# Patient Record
Sex: Male | Born: 1950 | Race: Black or African American | Hispanic: No | Marital: Married | Smoking: Former smoker
Health system: Southern US, Community
[De-identification: ages and names within clinical notes are randomized; demographics above are authoritative.]

## PROBLEM LIST (undated history)

## (undated) DIAGNOSIS — E119 Type 2 diabetes mellitus without complications: Secondary | ICD-10-CM

## (undated) DIAGNOSIS — E785 Hyperlipidemia, unspecified: Secondary | ICD-10-CM

## (undated) DIAGNOSIS — I10 Essential (primary) hypertension: Secondary | ICD-10-CM

## (undated) HISTORY — DX: Essential (primary) hypertension: I10

## (undated) HISTORY — DX: Hyperlipidemia, unspecified: E78.5

## (undated) HISTORY — DX: Type 2 diabetes mellitus without complications: E11.9

## (undated) HISTORY — PX: ROTATOR CUFF REPAIR: SHX139

---

## 2008-04-19 ENCOUNTER — Emergency Department (HOSPITAL_COMMUNITY): Admission: EM | Admit: 2008-04-19 | Discharge: 2008-04-20 | Payer: Self-pay | Admitting: Emergency Medicine

## 2011-10-23 ENCOUNTER — Ambulatory Visit (INDEPENDENT_AMBULATORY_CARE_PROVIDER_SITE_OTHER): Payer: 59 | Admitting: Family Medicine

## 2011-10-23 DIAGNOSIS — I10 Essential (primary) hypertension: Secondary | ICD-10-CM

## 2011-10-23 DIAGNOSIS — E785 Hyperlipidemia, unspecified: Secondary | ICD-10-CM

## 2011-10-23 DIAGNOSIS — T783XXA Angioneurotic edema, initial encounter: Secondary | ICD-10-CM

## 2011-10-23 DIAGNOSIS — E119 Type 2 diabetes mellitus without complications: Secondary | ICD-10-CM

## 2011-10-23 LAB — POCT CBC
Hemoglobin: 12.5 g/dL — AB (ref 14.1–18.1)
MCH, POC: 27.9 pg (ref 27–31.2)
MCV: 88.7 fL (ref 80–97)
RBC: 4.48 M/uL — AB (ref 4.69–6.13)
WBC: 7.5 10*3/uL (ref 4.6–10.2)

## 2011-10-23 MED ORDER — PREDNISONE 20 MG PO TABS: ORAL_TABLET | ORAL | Status: AC

## 2011-10-23 MED ORDER — FAMOTIDINE 40 MG PO TABS: 40.0000 mg | ORAL_TABLET | Freq: Every day | ORAL | Status: DC

## 2011-10-23 MED ORDER — CETIRIZINE HCL 10 MG PO TABS: 10.0000 mg | ORAL_TABLET | Freq: Every day | ORAL | Status: DC

## 2011-10-23 MED ORDER — METHYLPREDNISOLONE SODIUM SUCC 125 MG IJ SOLR
125.0000 mg | Freq: Once | INTRAMUSCULAR | Status: AC
  Administered 2011-10-23: 125 mg via INTRAVENOUS

## 2011-10-23 MED ORDER — EPINEPHRINE 0.3 MG/0.3ML IJ DEVI: 0.3000 mg | Freq: Once | INTRAMUSCULAR | Status: AC

## 2011-10-23 MED ORDER — SODIUM CHLORIDE 0.9 % IV SOLN: 125.0000 mg | Freq: Once | INTRAVENOUS | Status: DC

## 2011-10-23 NOTE — Progress Notes (Signed)
  Subjective:    Patient ID: Ryan Rosales, male    DOB: 1951-02-15, 61 y.o.   MRN: [id number]  HPI  Patient presents complaining of upper lip swelling that started this morning. Took BP medications, Norvasc and Lisinopril this AM and has been on these medications for some time. Takes Lipitor prn and has not had any in 5 days.  No new foods or body products No recent illness or HSV labialis  Review of Systems     Objective:   Physical Exam  Constitutional: He appears well-developed and well-nourished.  HENT:  Head: Normocephalic and atraumatic.  Mouth/Throat:    Neck: No thyromegaly present.  Cardiovascular: Normal rate, regular rhythm and normal heart sounds.   Pulmonary/Chest: Effort normal and breath sounds normal. He has no wheezes.  Lymphadenopathy:    He has no cervical adenopathy.  Neurological: He is alert.  Skin: Skin is warm. No rash (no urticaria) noted.  No swelling of tongue        Assessment & Plan:   1. Angioedema  POCT CBC, methylPREDNISolone sodium succinate (SOLU-MEDROL) 125 MG injection 125 mg, DISCONTINUED: methylPREDNISolone sodium succinate (SOLU-MEDROL) 130 mg in sodium chloride 0.9 % 50 mL IVPB  2. DM (diabetes mellitus)  POCT glucose (manual entry)  3. HTN (hypertension)    4. Dyslipidemia     See AVS and discharge medications Hold lisinopril until patient follows up with his primary MD.  Patient states he will schedule this appointment. Reviewed with patient proper indication and technique if Epi Pen is necessary. Anticipatory guidance.

## 2011-10-25 ENCOUNTER — Encounter: Payer: Self-pay | Admitting: Family Medicine

## 2011-10-25 ENCOUNTER — Telehealth: Payer: Self-pay

## 2011-10-25 NOTE — Telephone Encounter (Signed)
Pt records faxed via Epic. Ok to send records per Dr Hal Hope because Dr. Clarene Duke is patients PCP.

## 2011-10-25 NOTE — Telephone Encounter (Signed)
.  [facility] STATES THEY NEED THE LAST OV NOTES FAXED ON PT PLEASE FAX TO [phone] AND THE PHONE NUMBER IS [phone]

## 2013-05-19 ENCOUNTER — Ambulatory Visit (INDEPENDENT_AMBULATORY_CARE_PROVIDER_SITE_OTHER): Payer: 59 | Admitting: Family Medicine

## 2013-05-19 VITALS — BP 168/100 | HR 84 | Temp 98.2°F | Resp 16 | Ht 69.25 in | Wt 203.6 lb

## 2013-05-19 DIAGNOSIS — M25529 Pain in unspecified elbow: Secondary | ICD-10-CM

## 2013-05-19 DIAGNOSIS — M25522 Pain in left elbow: Secondary | ICD-10-CM

## 2013-05-19 DIAGNOSIS — M546 Pain in thoracic spine: Secondary | ICD-10-CM

## 2013-05-19 DIAGNOSIS — I10 Essential (primary) hypertension: Secondary | ICD-10-CM

## 2013-05-19 MED ORDER — CYCLOBENZAPRINE HCL 10 MG PO TABS: 10.0000 mg | ORAL_TABLET | Freq: Every evening | ORAL | Status: DC | PRN

## 2013-05-19 NOTE — Progress Notes (Signed)
  Subjective:    Patient ID: Ryan Rosales, male    DOB: [birth year], 62 y.o.   MRN: [id number]  HPI Left elbow pain and along left shoulder blade for 2 weeks. No injury. No history of gout. Has not been doing any strenuous house or yard work. Pain is sharp. Getting discoloration of skin over elbow. Tried his wife's lidocaine patches on back that helped some. Also tried some OTC Aleve with temporary relief. Pain is on tip of olecranon. Nothing seems to make pain worse. Good ROM of elbow. Pain along his scapula does bother him at night. His back is tender.   Review of Systems  Constitutional: Negative for fever and chills.  Neurological: Negative for weakness.  All other systems reviewed and are negative.       Objective:   Physical Exam  Constitutional: He appears well-developed and well-nourished. No distress.  HENT:  Head: Normocephalic and atraumatic.  Eyes: Conjunctivae are normal. No scleral icterus.  Cardiovascular: Normal rate.   Pulmonary/Chest: Effort normal.  Skin: Skin is warm and dry. No rash noted. No erythema.  Psychiatric: He has a normal mood and affect. His behavior is normal. Thought content normal.  MSK:  - Left Elbow: FROM in flexion, extension, pronation, supination without pain. Strength 5/5 without pain. Focal TTP over the tip of the olecranon with palpable thickening or abnormal deposits within. No TTP over medial or lateral epicondyle, antecubital fossa - Thoracic Back: No midline TTP. TTP over trap and rhomboid on left. Normal scapulohumeral rhythm. No winging     Assessment & Plan:  #1. Posterior left elbow pain - Suspect atypical olecranon bursitis as patient has typical pain but no swelling - Continue aleve 2 tabs bid - Ice tid x 15 min - Avoid pressure on olecranon, pad area - F/u with PCP in one week, consider oral prednisone if persistent - Do not suspect serious pathology within elbow joint or infection. Seems superficial in soft tissue so no xrays  at this time. Consider if no improvement in next couple weeks.  #2. Periscapular pain - Heat or ice as needed - Scapular stabilization exercises and theraband given - Flexeril qhs prn  #3. HTN, uncontrolled - notify PCP by phone tomorrow.

## 2013-05-19 NOTE — Patient Instructions (Addendum)
For elbow pain, I think that you have irritation of the olecranon bursa - ice 3x per day - use an elbow pad to avoid putting pressure on this - discuss possibility of trial of prednisone with PCP if no better by next week  For scapular pain - Do rowing exercises up high, same level, and down low 3 sets of 15 each arm - Use heat or ice - Take muscle relaxer at night.

## 2013-05-28 ENCOUNTER — Other Ambulatory Visit: Payer: Self-pay | Admitting: Family Medicine

## 2013-05-28 DIAGNOSIS — R748 Abnormal levels of other serum enzymes: Secondary | ICD-10-CM

## 2013-06-08 ENCOUNTER — Other Ambulatory Visit: Payer: Self-pay

## 2013-06-10 ENCOUNTER — Ambulatory Visit
Admission: RE | Admit: 2013-06-10 | Discharge: 2013-06-10 | Disposition: A | Discharge status: Home / Self Care | Payer: 59 | Source: Ambulatory Visit | Attending: Family Medicine | Admitting: Family Medicine

## 2013-06-10 DIAGNOSIS — R748 Abnormal levels of other serum enzymes: Secondary | ICD-10-CM

## 2013-07-17 NOTE — Progress Notes (Signed)
History and physical exam reviewed with Dr. Voss. Agree with A/P. 

## 2016-11-19 ENCOUNTER — Other Ambulatory Visit (HOSPITAL_COMMUNITY): Payer: Self-pay | Admitting: Gastroenterology

## 2016-11-19 DIAGNOSIS — K7581 Nonalcoholic steatohepatitis (NASH): Secondary | ICD-10-CM

## 2016-11-26 ENCOUNTER — Emergency Department (HOSPITAL_COMMUNITY): Payer: BLUE CROSS/BLUE SHIELD

## 2016-11-26 ENCOUNTER — Emergency Department (HOSPITAL_COMMUNITY)
Admission: EM | Admit: 2016-11-26 | Discharge: 2016-11-27 | Disposition: A | Discharge status: Home / Self Care | Payer: BLUE CROSS/BLUE SHIELD | Attending: Emergency Medicine | Admitting: Emergency Medicine

## 2016-11-26 ENCOUNTER — Encounter (HOSPITAL_COMMUNITY): Payer: Self-pay | Admitting: *Deleted

## 2016-11-26 DIAGNOSIS — Z79899 Other long term (current) drug therapy: Secondary | ICD-10-CM | POA: Diagnosis not present

## 2016-11-26 DIAGNOSIS — E119 Type 2 diabetes mellitus without complications: Secondary | ICD-10-CM | POA: Diagnosis not present

## 2016-11-26 DIAGNOSIS — R42 Dizziness and giddiness: Secondary | ICD-10-CM | POA: Insufficient documentation

## 2016-11-26 DIAGNOSIS — I959 Hypotension, unspecified: Secondary | ICD-10-CM | POA: Insufficient documentation

## 2016-11-26 DIAGNOSIS — Z87891 Personal history of nicotine dependence: Secondary | ICD-10-CM | POA: Diagnosis not present

## 2016-11-26 LAB — CBC WITH DIFFERENTIAL/PLATELET
BASOS PCT: 0 %
Basophils Absolute: 0 10*3/uL (ref 0.0–0.1)
EOS ABS: 0.2 10*3/uL (ref 0.0–0.7)
Eosinophils Relative: 4 %
HCT: 36.9 % — ABNORMAL LOW (ref 39.0–52.0)
Hemoglobin: 12.3 g/dL — ABNORMAL LOW (ref 13.0–17.0)
Lymphocytes Relative: 50 %
Lymphs Abs: 2.5 10*3/uL (ref 0.7–4.0)
MCH: 30.6 pg (ref 26.0–34.0)
MCHC: 33.3 g/dL (ref 30.0–36.0)
MCV: 91.8 fL (ref 78.0–100.0)
MONO ABS: 0.4 10*3/uL (ref 0.1–1.0)
MONOS PCT: 8 %
NEUTROS PCT: 38 %
Neutro Abs: 1.9 10*3/uL (ref 1.7–7.7)
PLATELETS: 215 10*3/uL (ref 150–400)
RBC: 4.02 MIL/uL — ABNORMAL LOW (ref 4.22–5.81)
RDW: 12.6 % (ref 11.5–15.5)
WBC: 5 10*3/uL (ref 4.0–10.5)

## 2016-11-26 MED ORDER — SODIUM CHLORIDE 0.9 % IV BOLUS (SEPSIS)
1000.0000 mL | Freq: Once | INTRAVENOUS | Status: AC
  Administered 2016-11-27: 1000 mL via INTRAVENOUS

## 2016-11-26 NOTE — ED Provider Notes (Signed)
[facility] DEPT Provider Note   CSN: [id number] Arrival date & time: 11/26/16  2131  By signing my name below, I, Margit Banda, attest that this documentation has been prepared under the direction and in the presence of Veryl Speak, MD. Electronically Signed: Margit Banda, ED Scribe. 11/26/16. 11:34 PM.   History   Chief Complaint Chief Complaint  Patient presents with  . Fatigue  . Hypotension    HPI Ryan Rosales is a 66 y.o. male with a PMHx of HTN, who presents to the Emergency Department complaining of a sudden drop in his blood pressure ~7/8 pm on 11/26/16. Pt reports taking his BP every morning (135/88) using his at home machine. About 12 hours later, while eating dinner, he became dizzy and confused. Per pt's wife, he struggled getting his food into his mouth, he had a hard time following directions, his face drooped and he seemed drunk, although he had not had any ETOH that day. They retook his BP and it was 85/66. Sx lasted ~ 5-10 minutes before resolving. Pt notes feeling groggy. Pt takes his HTN medication as prescribed. Pt denies weakness, fever, diarrhea, urgency, frequency, hematuria, dysuria, difficulty urinating and constipation.   The history is provided by the patient and the spouse. No language interpreter was used.    Past Medical History:  Diagnosis Date  . Diabetes mellitus without complication ([place])   . Hyperlipidemia   . Hypertension     Patient Active Problem List   Diagnosis Date Noted  . HTN (hypertension) 05/19/2013    Past Surgical History:  Procedure Laterality Date  . ROTATOR CUFF REPAIR         Home Medications    Prior to Admission medications   Medication Sig Start Date End Date Taking? Authorizing Provider  amLODipine (NORVASC) 5 MG tablet Take 5 mg by mouth daily.   Yes Historical Provider, MD  EPINEPHrine (EPIPEN) 0.3 mg/0.3 mL DEVI Inject 0.3 mLs (0.3 mg total) into the muscle once. 10/23/11  Yes Hayden Rasmussen, MD    losartan (COZAAR) 100 MG tablet Take 100 mg by mouth daily. 11/07/16  Yes Historical Provider, MD  Omega-3 Fatty Acids (FISH OIL PO) Take 1 capsule by mouth 2 (two) times daily.   Yes Historical Provider, MD  ranitidine (ZANTAC) 150 MG tablet Take 150 mg by mouth daily.    Yes Historical Provider, MD  cetirizine (ZYRTEC) 10 MG tablet Take 1 tablet (10 mg total) by mouth daily. Patient not taking: Reported on 11/26/2016 10/23/11 10/22/12  Hayden Rasmussen, MD  cyclobenzaprine (FLEXERIL) 10 MG tablet Take 1 tablet (10 mg total) by mouth at bedtime as needed for muscle spasms. Patient not taking: Reported on 11/26/2016 05/19/13   Duane Boston, MD  famotidine (PEPCID) 40 MG tablet Take 1 tablet (40 mg total) by mouth daily. Patient not taking: Reported on 11/26/2016 10/23/11 10/22/12  Hayden Rasmussen, MD    Family History Family History  Problem Relation Age of Onset  . Diabetes Mother     Social History Social History  Substance Use Topics  . Smoking status: Former Smoker    Types: Cigarettes    Quit date: 10/22/1962  . Smokeless tobacco: Never Used  . Alcohol use Yes     Allergies   Lisinopril   Review of Systems Review of Systems  Constitutional: Negative for fever.  Gastrointestinal: Negative for constipation and diarrhea.  Genitourinary: Negative for difficulty urinating, dysuria, frequency, hematuria and urgency.  Neurological: Positive for dizziness. Negative  for weakness.  All other systems reviewed and are negative.    Physical Exam Updated Vital Signs BP 112/75 (BP Location: Left Arm)   Pulse 94   Temp 98 F (36.7 C) (Oral)   Resp 18   SpO2 98%   Physical Exam  Constitutional: He appears well-developed and well-nourished.  HENT:  Head: Normocephalic.  Mouth/Throat: Oropharynx is clear and moist. No oropharyngeal exudate.  Eyes: Conjunctivae and EOM are normal. Pupils are equal, round, and reactive to light. Right eye exhibits no discharge. Left eye exhibits no  discharge. No scleral icterus.  Neck: Normal range of motion. Neck supple. No JVD present. No tracheal deviation present.  Trachea is midline. No stridor or carotid bruits.  Cardiovascular: Normal rate, regular rhythm, normal heart sounds and intact distal pulses.   No murmur heard. Pulmonary/Chest: Effort normal and breath sounds normal. No stridor. No respiratory distress. He has no wheezes. He has no rales.  Lungs CTA bilaterally.  Abdominal: Soft. Bowel sounds are normal. He exhibits no distension. There is no tenderness. There is no rebound and no guarding.  Musculoskeletal: Normal range of motion. He exhibits no edema or tenderness.  All compartments are soft. No palpable cords.   Lymphadenopathy:    He has no cervical adenopathy.  Neurological: He is alert. He has normal reflexes. No cranial nerve deficit. He exhibits normal muscle tone. Coordination normal.  Skin: Skin is warm and dry. Capillary refill takes less than 2 seconds.  Psychiatric: He has a normal mood and affect. His behavior is normal.  Nursing note and vitals reviewed.    ED Treatments / Results  DIAGNOSTIC STUDIES: Oxygen Saturation is 98% on RA, normal by my interpretation.   COORDINATION OF CARE: 11:19 PM-Discussed next steps with pt which include a head scan. Pt verbalized understanding and is agreeable with the plan.    Labs (all labs ordered are listed, but only abnormal results are displayed) Labs Reviewed - No data to display  EKG  EKG Interpretation None       Radiology No results found.  Procedures Procedures (including critical care time)  Medications Ordered in ED Medications - No data to display   Initial Impression / Assessment and Plan / ED Course  I have reviewed the triage vital signs and the nursing notes.  Pertinent labs & imaging results that were available during my care of the patient were reviewed by me and considered in my medical decision making (see chart for  details).  Patient presents here after an episode of hypotension and confusion that occurred at home. This lasted for approximately 5 minutes, then resolved. His blood pressures here have been stable and his workup reveals no obvious abnormality. Head CT is negative, EKG is unchanged, and laboratory studies are unremarkable.  I am uncertain as to what caused this episode. He had some sort of brief hypotensive episode according to the wife. I have considered the possibility of TIA, however do not feel this to be the case. Either way, I have advised him to follow-up with his primary Dr. in the next 2 days and return if his symptoms worsen or change.  Final Clinical Impressions(s) / ED Diagnoses   Final diagnoses:  None    New Prescriptions New Prescriptions   No medications on file   I personally performed the services described in this documentation, which was scribed in my presence. The recorded information has been reviewed and is accurate.       Veryl Speak, MD 11/27/16  0100  

## 2016-11-26 NOTE — ED Triage Notes (Signed)
Pt states he felt lethargic while eating at ~7PM today. Pt took his blood pressure which was 88/59 at the time. Pt states he still feels "a little bit woozy".   Pt states he has been taking his blood pressure medication as prescribed. Pt denies nausea, states he has had some dizziness.

## 2016-11-27 LAB — COMPREHENSIVE METABOLIC PANEL
ALT: 32 U/L (ref 17–63)
ANION GAP: 9 (ref 5–15)
AST: 35 U/L (ref 15–41)
Albumin: 4.1 g/dL (ref 3.5–5.0)
Alkaline Phosphatase: 65 U/L (ref 38–126)
BILIRUBIN TOTAL: 0.7 mg/dL (ref 0.3–1.2)
BUN: 18 mg/dL (ref 6–20)
CHLORIDE: 108 mmol/L (ref 101–111)
CO2: 26 mmol/L (ref 22–32)
Calcium: 9 mg/dL (ref 8.9–10.3)
Creatinine, Ser: 0.79 mg/dL (ref 0.61–1.24)
GFR calc Af Amer: >60 mL/min (ref >60)
Glucose, Bld: 116 mg/dL — ABNORMAL HIGH (ref 65–99)
POTASSIUM: 4.1 mmol/L (ref 3.5–5.1)
Sodium: 143 mmol/L (ref 135–145)
Total Protein: 7.7 g/dL (ref 6.5–8.1)

## 2016-11-27 LAB — TROPONIN I: Troponin I: <0.03 ng/mL (ref <0.03)

## 2016-11-27 NOTE — Discharge Instructions (Signed)
Continue your medications as before.  Follow-up with your primary Dr. in the next 2-3 days.

## 2016-12-17 ENCOUNTER — Ambulatory Visit (HOSPITAL_COMMUNITY)
Admission: RE | Admit: 2016-12-17 | Discharge: 2016-12-17 | Disposition: A | Discharge status: Home / Self Care | Payer: BLUE CROSS/BLUE SHIELD | Source: Ambulatory Visit | Attending: Gastroenterology | Admitting: Gastroenterology

## 2016-12-17 DIAGNOSIS — K7581 Nonalcoholic steatohepatitis (NASH): Secondary | ICD-10-CM

## 2016-12-24 ENCOUNTER — Ambulatory Visit (HOSPITAL_COMMUNITY): Payer: BLUE CROSS/BLUE SHIELD

## 2016-12-24 ENCOUNTER — Encounter (HOSPITAL_COMMUNITY): Payer: Self-pay

## 2017-01-02 ENCOUNTER — Ambulatory Visit (HOSPITAL_COMMUNITY)
Admission: RE | Admit: 2017-01-02 | Discharge: 2017-01-02 | Disposition: A | Discharge status: Home / Self Care | Payer: BLUE CROSS/BLUE SHIELD | Source: Ambulatory Visit | Attending: Gastroenterology | Admitting: Gastroenterology

## 2017-01-02 DIAGNOSIS — K7581 Nonalcoholic steatohepatitis (NASH): Secondary | ICD-10-CM | POA: Diagnosis not present

## 2017-06-13 ENCOUNTER — Other Ambulatory Visit: Payer: Self-pay | Admitting: Family Medicine

## 2017-06-13 DIAGNOSIS — R634 Abnormal weight loss: Secondary | ICD-10-CM

## 2017-06-17 ENCOUNTER — Ambulatory Visit
Admission: RE | Admit: 2017-06-17 | Discharge: 2017-06-17 | Disposition: A | Discharge status: Home / Self Care | Payer: BLUE CROSS/BLUE SHIELD | Source: Ambulatory Visit | Attending: Family Medicine | Admitting: Family Medicine

## 2017-06-17 DIAGNOSIS — R634 Abnormal weight loss: Secondary | ICD-10-CM

## 2017-06-17 MED ORDER — IOPAMIDOL (ISOVUE-300) INJECTION 61%
100.0000 mL | Freq: Once | INTRAVENOUS | Status: AC | PRN
  Administered 2017-06-17: 100 mL via INTRAVENOUS

## 2017-06-18 ENCOUNTER — Other Ambulatory Visit: Payer: Self-pay | Admitting: Family Medicine

## 2017-06-18 DIAGNOSIS — E041 Nontoxic single thyroid nodule: Secondary | ICD-10-CM

## 2017-06-25 ENCOUNTER — Other Ambulatory Visit: Payer: BLUE CROSS/BLUE SHIELD

## 2017-06-25 ENCOUNTER — Emergency Department (HOSPITAL_COMMUNITY): Payer: BLUE CROSS/BLUE SHIELD

## 2017-06-25 ENCOUNTER — Emergency Department (HOSPITAL_COMMUNITY)
Admission: EM | Admit: 2017-06-25 | Discharge: 2017-06-25 | Disposition: A | Discharge status: Home / Self Care | Payer: BLUE CROSS/BLUE SHIELD | Attending: Emergency Medicine | Admitting: Emergency Medicine

## 2017-06-25 ENCOUNTER — Encounter (HOSPITAL_COMMUNITY): Payer: Self-pay | Admitting: *Deleted

## 2017-06-25 DIAGNOSIS — I10 Essential (primary) hypertension: Secondary | ICD-10-CM | POA: Insufficient documentation

## 2017-06-25 DIAGNOSIS — R55 Syncope and collapse: Secondary | ICD-10-CM | POA: Diagnosis present

## 2017-06-25 DIAGNOSIS — Z87891 Personal history of nicotine dependence: Secondary | ICD-10-CM | POA: Insufficient documentation

## 2017-06-25 DIAGNOSIS — Z79899 Other long term (current) drug therapy: Secondary | ICD-10-CM | POA: Insufficient documentation

## 2017-06-25 DIAGNOSIS — R4182 Altered mental status, unspecified: Secondary | ICD-10-CM | POA: Diagnosis not present

## 2017-06-25 DIAGNOSIS — F10920 Alcohol use, unspecified with intoxication, uncomplicated: Secondary | ICD-10-CM | POA: Diagnosis not present

## 2017-06-25 DIAGNOSIS — R61 Generalized hyperhidrosis: Secondary | ICD-10-CM | POA: Insufficient documentation

## 2017-06-25 DIAGNOSIS — E119 Type 2 diabetes mellitus without complications: Secondary | ICD-10-CM | POA: Diagnosis not present

## 2017-06-25 LAB — T4, FREE: FREE T4: 1.04 ng/dL (ref 0.61–1.12)

## 2017-06-25 LAB — CBC
HEMATOCRIT: 38 % — AB (ref 39.0–52.0)
Hemoglobin: 12 g/dL — ABNORMAL LOW (ref 13.0–17.0)
MCH: 30.2 pg (ref 26.0–34.0)
MCHC: 31.6 g/dL (ref 30.0–36.0)
MCV: 95.7 fL (ref 78.0–100.0)
Platelets: 223 10*3/uL (ref 150–400)
RBC: 3.97 MIL/uL — ABNORMAL LOW (ref 4.22–5.81)
RDW: 15.5 % (ref 11.5–15.5)
WBC: 3.8 10*3/uL — AB (ref 4.0–10.5)

## 2017-06-25 LAB — COMPREHENSIVE METABOLIC PANEL
ALBUMIN: 4.2 g/dL (ref 3.5–5.0)
ALT: 38 U/L (ref 17–63)
ANION GAP: 8 (ref 5–15)
AST: 46 U/L — AB (ref 15–41)
Alkaline Phosphatase: 61 U/L (ref 38–126)
BUN: 8 mg/dL (ref 6–20)
CALCIUM: 8.9 mg/dL (ref 8.9–10.3)
CO2: 29 mmol/L (ref 22–32)
Chloride: 106 mmol/L (ref 101–111)
Creatinine, Ser: 0.69 mg/dL (ref 0.61–1.24)
GFR calc Af Amer: >60 mL/min (ref >60)
GFR calc non Af Amer: >60 mL/min (ref >60)
Glucose, Bld: 115 mg/dL — ABNORMAL HIGH (ref 65–99)
POTASSIUM: 3.4 mmol/L — AB (ref 3.5–5.1)
SODIUM: 143 mmol/L (ref 135–145)
TOTAL PROTEIN: 7.9 g/dL (ref 6.5–8.1)
Total Bilirubin: 0.6 mg/dL (ref 0.3–1.2)

## 2017-06-25 LAB — RAPID URINE DRUG SCREEN, HOSP PERFORMED
Amphetamines: NOT DETECTED
BARBITURATES: NOT DETECTED
BENZODIAZEPINES: NOT DETECTED
Cocaine: NOT DETECTED
Opiates: NOT DETECTED
Tetrahydrocannabinol: NOT DETECTED

## 2017-06-25 LAB — CBG MONITORING, ED: GLUCOSE-CAPILLARY: 110 mg/dL — AB (ref 65–99)

## 2017-06-25 LAB — TSH: TSH: 0.357 u[IU]/mL (ref 0.350–4.500)

## 2017-06-25 LAB — ETHANOL: ALCOHOL ETHYL (B): 362 mg/dL — AB (ref <10)

## 2017-06-25 MED ORDER — SODIUM CHLORIDE 0.9 % IV BOLUS (SEPSIS)
1000.0000 mL | Freq: Once | INTRAVENOUS | Status: AC
  Administered 2017-06-25: 1000 mL via INTRAVENOUS

## 2017-06-25 NOTE — ED Notes (Signed)
Per family, they found pt unresponsive in his car. Once awake pt was having confusion. Reports recent abnormal labs and thyroid nodules, was scheduled today for an [place] of his thyroid.

## 2017-06-25 NOTE — ED Notes (Signed)
Patient transported to CT 

## 2017-06-25 NOTE — ED Triage Notes (Signed)
Pt arrived by [facility]. Family called ems for altered mental status. Pt reports having one shot of alcohol this afternoon. Pt seems slow to respond and difficultly with speech. Pt states "I just dont feel right." grips are equal at triage.

## 2017-06-25 NOTE — ED Provider Notes (Signed)
[facility] EMERGENCY DEPARTMENT Provider Note   CSN: [id number] Arrival date & time: 06/25/17  1347     History   Chief Complaint Chief Complaint  Patient presents with  . Altered Mental Status    HPI Ryan Rosales is a 66 y.o. male.  HPI   66 year old male with hx of HTN, DM, HLD, presenting to the ER via EMS due to syncope.  Around 12 PM today wife found him slump over steering wheel of his car unconscious.  Was able to regain consciousness and ambulate into the house when he appears to lose consciousness again.  Last thing he remember is walking to the stretcher of the ambulate approximately 2 hrs ago.  Wife sts pt appears confused.  For the past 3-4 months pt has lost 45 lbs unintentionally. Also has loss of appetite and occasional vomiting without nausea or diarrhea. Over the past month, he has trouble with balance when walking.  Has night sweats, chills, tremors, and mood swing.  Was seen by PCP for this problem 2 weeks ago.  Has labs and CT scans.  Was noted to have nodules on his thyroids.  Has a scheduled thyroid US today but it was cancelled due to this event.  Had colonoscopy last April and it was normal.  Denies tobacco use, does use 2 alcoholic beverage per week, none recently.  Family hx significant for father with brain tumor.  LBM this AM, normal.  No GU sxs.  Wife did mention that patient lost his sister due to a horrible accident a year ago and is still currently grieving the death.  When asked he is depressed, patient states he he may be depressed.  Denies SI/HI.   Past Medical History:  Diagnosis Date  . Diabetes mellitus without complication ([place])   . Hyperlipidemia   . Hypertension     Patient Active Problem List   Diagnosis Date Noted  . HTN (hypertension) 05/19/2013    Past Surgical History:  Procedure Laterality Date  . ROTATOR CUFF REPAIR         Home Medications    Prior to Admission medications   Medication Sig Start Date  End Date Taking? Authorizing Provider  amLODipine (NORVASC) 5 MG tablet Take 5 mg by mouth daily.   Yes [provider]  aspirin-acetaminophen-caffeine (EXCEDRIN MIGRAINE) [phone] MG tablet Take by mouth every 6 (six) hours as needed for headache.   Yes [provider]  B Complex-C (B-COMPLEX WITH VITAMIN C) tablet Take 1 tablet by mouth daily.   Yes [provider]  cholecalciferol (VITAMIN D) 1000 units tablet Take 1,000 Units by mouth daily.   Yes [provider]  EPINEPHrine (EPIPEN) 0.3 mg/0.3 mL DEVI Inject 0.3 mLs (0.3 mg total) into the muscle once. 10/23/11  Yes Hayden Rasmussen, MD  ferrous sulfate 325 (65 FE) MG tablet Take 325 mg by mouth daily with breakfast.   Yes [provider]  losartan (COZAAR) 100 MG tablet Take 100 mg by mouth daily. 11/07/16  Yes [provider]  losartan (COZAAR) 50 MG tablet Take 50 mg by mouth daily. 04/06/17  Yes [provider]  Multiple Vitamin (MULTIVITAMIN) capsule Take 1 capsule by mouth daily.   Yes [provider]  Omega-3 Fatty Acids (FISH OIL PO) Take 1 capsule by mouth 2 (two) times daily.   Yes [provider]  ranitidine (ZANTAC) 150 MG tablet Take 150 mg by mouth daily.    Yes [provider]  cetirizine (ZYRTEC) 10 MG tablet Take 1 tablet (10 mg total) by mouth daily. Patient not taking: Reported on 11/26/2016 10/23/11 10/22/12  Hayden Rasmussen, MD  cyclobenzaprine (FLEXERIL) 10 MG tablet Take 1 tablet (10 mg total) by mouth at bedtime as needed for muscle spasms. Patient not taking: Reported on 11/26/2016 05/19/13   Duane Boston, MD  famotidine (PEPCID) 40 MG tablet Take 1 tablet (40 mg total) by mouth daily. Patient not taking: Reported on 11/26/2016 10/23/11 10/22/12  Hayden Rasmussen, MD    Family History Family History  Problem Relation Age of Onset  . Diabetes Mother     Social History Social History  Substance Use Topics  . Smoking status: Former  Smoker    Types: Cigarettes    Quit date: 10/22/1962  . Smokeless tobacco: Never Used  . Alcohol use Yes     Allergies   Lisinopril   Review of Systems Review of Systems  All other systems reviewed and are negative.    Physical Exam Updated Vital Signs BP (!) 146/92 (BP Location: Left Arm)   Pulse 79   Temp 97.6 F (36.4 C) (Oral)   Resp 14   Ht 5\' 9"  (1.753 m)   Wt 83.9 kg (185 lb)   SpO2 100%   BMI 27.32 kg/m   Physical Exam  Constitutional: He is oriented to person, place, and time. He appears well-developed and well-nourished. No distress.  Nontoxic in appearance  HENT:  Head: Atraumatic.  Eyes: Pupils are equal, round, and reactive to light. Conjunctivae and EOM are normal.  Fatigable horizontal nystagmus  Neck: Normal range of motion. Neck supple.  No nuchal rigidity, no thyromegaly  Cardiovascular: Normal rate and regular rhythm.   Pulmonary/Chest: Effort normal and breath sounds normal.  Abdominal: Soft. Bowel sounds are normal. He exhibits no distension. There is no tenderness.  Neurological: He is alert and oriented to person, place, and time.  Neurologic exam:  Speech clear, pupils equal round reactive to light, extraocular movements intact  Normal peripheral visual fields Cranial nerves III through XII normal including no facial droop Follows commands, moves all extremities x4, normal strength to bilateral upper and lower extremities at all major muscle groups including grip Sensation normal to light touch  Coordination intact, no limb ataxia, finger-nose-finger with some past pointing bilaterally. Rapid alternating movements delay No pronator drift Gait with wide stance, poor heel to toe gait.    Skin: No rash noted.  Psychiatric: He has a normal mood and affect.  Nursing note and vitals reviewed.    ED Treatments / Results  Labs (all labs ordered are listed, but only abnormal results are displayed) Labs Reviewed  COMPREHENSIVE METABOLIC  PANEL - Abnormal; Notable for the following:       Result Value   Potassium 3.4 (*)    Glucose, Bld 115 (*)    AST 46 (*)    All other components within normal limits  CBC - Abnormal; Notable for the following:    WBC 3.8 (*)    RBC 3.97 (*)    Hemoglobin 12.0 (*)    HCT 38.0 (*)    All other components within normal limits  ETHANOL - Abnormal; Notable for the following:    Alcohol, Ethyl (B) 362 (*)    All other components within normal limits  CBG MONITORING, ED - Abnormal; Notable for the following:    Glucose-Capillary 110 (*)    All other components within normal limits  RAPID  URINE DRUG SCREEN, HOSP PERFORMED  TSH  T4, FREE  URINALYSIS, ROUTINE W REFLEX MICROSCOPIC    EKG  EKG Interpretation None      Date: 06/25/2017  Rate: 88  Rhythm: normal sinus rhythm  QRS Axis: normal  Intervals: normal  ST/T Wave abnormalities: normal  Conduction Disutrbances: none  Narrative Interpretation:   Old EKG Reviewed: No significant changes noted     Radiology Ct Head Wo Contrast  Result Date: 06/25/2017 CLINICAL DATA:  66 year old male with altered level of consciousness. EXAM: CT HEAD WITHOUT CONTRAST TECHNIQUE: Contiguous axial images were obtained from the base of the skull through the vertex without intravenous contrast. COMPARISON:  11/26/2016 CT FINDINGS: Brain: No evidence of acute infarction, hemorrhage, hydrocephalus, extra-axial collection or mass lesion/mass effect. Atrophy and mild chronic small-vessel white matter ischemic changes noted. Vascular: Intracranial atherosclerotic calcifications identified. Skull: Normal. Negative for fracture or focal lesion. Sinuses/Orbits: No acute finding. Other: None IMPRESSION: 1. No evidence of acute intracranial abnormality 2. Atrophy and mild chronic small-vessel white matter ischemic changes Electronically Signed   By: Margarette Canada M.D.   On: 06/25/2017 17:41    Procedures Procedures (including critical care  time)  Medications Ordered in ED Medications  sodium chloride 0.9 % bolus 1,000 mL (0 mLs Intravenous Stopped 06/25/17 2030)     Initial Impression / Assessment and Plan / ED Course  I have reviewed the triage vital signs and the nursing notes.  Pertinent labs & imaging results that were available during my care of the patient were reviewed by me and considered in my medical decision making (see chart for details).     BP (!) 142/87   Pulse 72   Temp 97.6 F (36.4 C) (Oral)   Resp 14   Ht 5\' 9"  (1.753 m)   Wt 83.9 kg (185 lb)   SpO2 96%   BMI 27.32 kg/m    Final Clinical Impressions(s) / ED Diagnoses   Final diagnoses:  Alcoholic intoxication without complication ([facility])    New Prescriptions New Prescriptions   No medications on file   6:07 PM The patient brought here due to having several syncopal episodes.  Speech is slurred, coordination is a bit off especially with heel to toe ambulation.  Patient appears to be intoxicated, alcohol level 362.  Suspect symptoms likely secondary to alcohol intoxication.  The remainder of his labs are reassuring.  Head CT scan without acute changes, normal thyroid level, UDS negative, EKG unremarkable, labs are reassuring.  Recommend avoidance of alcohol and follow-up with primary care provider for further care.  Patient denies SI/HI.  Patient does have wife at bedside who he feels safe going home with.     Domenic Moras, PA-C 06/25/17 2100    Deno Etienne, DO 06/25/17 2257

## 2017-06-25 NOTE — ED Notes (Signed)
PA notified of ETOH level.

## 2017-07-07 ENCOUNTER — Telehealth: Payer: Self-pay | Admitting: Oncology

## 2017-07-07 ENCOUNTER — Ambulatory Visit
Admission: RE | Admit: 2017-07-07 | Discharge: 2017-07-07 | Disposition: A | Discharge status: Home / Self Care | Payer: BLUE CROSS/BLUE SHIELD | Source: Ambulatory Visit | Attending: Family Medicine | Admitting: Family Medicine

## 2017-07-07 DIAGNOSIS — E041 Nontoxic single thyroid nodule: Secondary | ICD-10-CM

## 2017-07-07 NOTE — Telephone Encounter (Signed)
Patient returned call re new patient referral and was given appointment with Dr. Alen Blew for 11/13 @ 11 am.

## 2017-07-15 ENCOUNTER — Telehealth: Payer: Self-pay | Admitting: Oncology

## 2017-07-15 ENCOUNTER — Ambulatory Visit (HOSPITAL_BASED_OUTPATIENT_CLINIC_OR_DEPARTMENT_OTHER): Payer: BLUE CROSS/BLUE SHIELD

## 2017-07-15 ENCOUNTER — Ambulatory Visit: Payer: BLUE CROSS/BLUE SHIELD | Admitting: Oncology

## 2017-07-15 VITALS — BP 159/87 | HR 93 | Temp 98.5°F | Resp 18 | Ht 69.0 in | Wt 186.9 lb

## 2017-07-15 DIAGNOSIS — I10 Essential (primary) hypertension: Secondary | ICD-10-CM

## 2017-07-15 DIAGNOSIS — D472 Monoclonal gammopathy: Secondary | ICD-10-CM | POA: Diagnosis not present

## 2017-07-15 DIAGNOSIS — E119 Type 2 diabetes mellitus without complications: Secondary | ICD-10-CM | POA: Diagnosis not present

## 2017-07-15 DIAGNOSIS — R7989 Other specified abnormal findings of blood chemistry: Secondary | ICD-10-CM

## 2017-07-15 DIAGNOSIS — R634 Abnormal weight loss: Secondary | ICD-10-CM | POA: Diagnosis not present

## 2017-07-15 DIAGNOSIS — K769 Liver disease, unspecified: Secondary | ICD-10-CM

## 2017-07-15 LAB — COMPREHENSIVE METABOLIC PANEL
ALT: 46 U/L (ref 0–55)
ANION GAP: 14 meq/L — AB (ref 3–11)
AST: 55 U/L — ABNORMAL HIGH (ref 5–34)
Albumin: 4.5 g/dL (ref 3.5–5.0)
Alkaline Phosphatase: 74 U/L (ref 40–150)
BUN: 15.5 mg/dL (ref 7.0–26.0)
CALCIUM: 9.6 mg/dL (ref 8.4–10.4)
CHLORIDE: 103 meq/L (ref 98–109)
CO2: 26 meq/L (ref 22–29)
Creatinine: 0.8 mg/dL (ref 0.7–1.3)
Glucose: 95 mg/dl (ref 70–140)
POTASSIUM: 3.7 meq/L (ref 3.5–5.1)
Sodium: 143 mEq/L (ref 136–145)
Total Bilirubin: 0.92 mg/dL (ref 0.20–1.20)
Total Protein: 8.8 g/dL — ABNORMAL HIGH (ref 6.4–8.3)

## 2017-07-15 LAB — CBC WITH DIFFERENTIAL/PLATELET
BASO%: 1.2 % (ref 0.0–2.0)
BASOS ABS: 0 10*3/uL (ref 0.0–0.1)
EOS%: 1.4 % (ref 0.0–7.0)
Eosinophils Absolute: 0.1 10*3/uL (ref 0.0–0.5)
HCT: 40.2 % (ref 38.4–49.9)
HGB: 13.2 g/dL (ref 13.0–17.1)
LYMPH%: 49.8 % — AB (ref 14.0–49.0)
MCH: 30.5 pg (ref 27.2–33.4)
MCHC: 32.7 g/dL (ref 32.0–36.0)
MCV: 93.3 fL (ref 79.3–98.0)
MONO#: 0.3 10*3/uL (ref 0.1–0.9)
MONO%: 7.4 % (ref 0.0–14.0)
NEUT#: 1.5 10*3/uL (ref 1.5–6.5)
NEUT%: 40.2 % (ref 39.0–75.0)
PLATELETS: 161 10*3/uL (ref 140–400)
RBC: 4.31 10*6/uL (ref 4.20–5.82)
RDW: 14.7 % — ABNORMAL HIGH (ref 11.0–14.6)
WBC: 3.8 10*3/uL — ABNORMAL LOW (ref 4.0–10.3)
lymph#: 1.9 10*3/uL (ref 0.9–3.3)

## 2017-07-15 NOTE — Progress Notes (Signed)
Reason for Referral: Evaluation for plasma cell disorder.  HPI: 66 year old gentleman currently of [place] where he has recently retired from working for the city.  He is a gentleman with history of diabetes, hyperlipidemia but for the most part in reasonable health.  He reports he has been doing reasonably well until her recent travel to [place] in April 2018.  Upon his return, he started losing weight with close to 30 pound weight loss at that time.  His evaluation by Dr. Rex Kras included imaging studies of the chest abdomen and pelvis as well as ultrasound of the abdomen did not show any clear-cut malignancy.  He also had an endoscopy which did not show any abnormalities.  His laboratory testing did show an elevated total protein and slight elevation in his liver function tests.  He had an abnormal serum protein electrophoresis with polyclonal findings.  Clinically, he reports improvement in his overall symptoms in the last few weeks.  He has gained close to 20 pounds although still below his average weight.  He has no other specific symptoms.  He denies any fevers or chills.  He denies any abdominal pain, pathological fractures, neuropathy or recurrent infections.  He does not report any headaches, blurry vision, syncope or seizures.  He does not report any fevers, chills or sweats.  He does not report any cough, wheezing or hemoptysis.  He does not report any chest pain, palpitation, orthopnea or leg edema.  He does not report any frequency urgency or hesitancy.  He does not report a skeletal complaints.  He does not report arthralgias or myalgias.  Remaining review of systems unremarkable.  Past Medical History:  Diagnosis Date  . Diabetes mellitus without complication ([place])   . Hyperlipidemia   . Hypertension   :  Past Surgical History:  Procedure Laterality Date  . ROTATOR CUFF REPAIR    :   Current Outpatient Medications:  .  amLODipine (NORVASC) 5 MG tablet, Take 5 mg by mouth  daily., Disp: , Rfl:  .  B Complex-C (B-COMPLEX WITH VITAMIN C) tablet, Take 1 tablet by mouth daily., Disp: , Rfl:  .  cetirizine (ZYRTEC) 10 MG tablet, Take 1 tablet (10 mg total) by mouth daily. (Patient not taking: Reported on 11/26/2016), Disp: 30 tablet, Rfl: 11 .  cholecalciferol (VITAMIN D) 1000 units tablet, Take 1,000 Units by mouth daily., Disp: , Rfl:  .  cyclobenzaprine (FLEXERIL) 10 MG tablet, Take 1 tablet (10 mg total) by mouth at bedtime as needed for muscle spasms. (Patient not taking: Reported on 11/26/2016), Disp: 30 tablet, Rfl: 0 .  EPINEPHrine (EPIPEN) 0.3 mg/0.3 mL DEVI, Inject 0.3 mLs (0.3 mg total) into the muscle once., Disp: 1 Device, Rfl: 2 .  famotidine (PEPCID) 40 MG tablet, Take 1 tablet (40 mg total) by mouth daily. (Patient not taking: Reported on 11/26/2016), Disp: 30 tablet, Rfl: 0 .  ferrous sulfate 325 (65 FE) MG tablet, Take 325 mg by mouth daily with breakfast., Disp: , Rfl:  .  losartan (COZAAR) 100 MG tablet, Take 100 mg by mouth daily., Disp: , Rfl: 3 .  losartan (COZAAR) 50 MG tablet, Take 50 mg by mouth daily., Disp: , Rfl: 3 .  Multiple Vitamin (MULTIVITAMIN) capsule, Take 1 capsule by mouth daily., Disp: , Rfl:  .  Omega-3 Fatty Acids (FISH OIL PO), Take 1 capsule by mouth 2 (two) times daily., Disp: , Rfl:  .  ranitidine (ZANTAC) 150 MG tablet, Take 150 mg by mouth daily. , Disp: ,  Rfl: :  Allergies  Allergen Reactions  . Lisinopril Swelling  :  Family History  Problem Relation Age of Onset  . Diabetes Mother   :  Social History   Socioeconomic History  . Marital status: Married    Spouse name: Not on file  . Number of children: Not on file  . Years of education: Not on file  . Highest education level: Not on file  Social Needs  . Financial resource strain: Not on file  . Food insecurity - worry: Not on file  . Food insecurity - inability: Not on file  . Transportation needs - medical: Not on file  . Transportation needs - non-medical:  Not on file  Occupational History  . Not on file  Tobacco Use  . Smoking status: Former Smoker    Types: Cigarettes    Last attempt to quit: 10/22/1962    Years since quitting: 54.7  . Smokeless tobacco: Never Used  Substance and Sexual Activity  . Alcohol use: Yes  . Drug use: Not on file  . Sexual activity: Not on file  Other Topics Concern  . Not on file  Social History Narrative  . Not on file  :  Pertinent items are noted in HPI.  Exam: Blood pressure (!) 159/87, pulse 93, temperature 98.5 F (36.9 C), temperature source Oral, resp. rate 18, height _0  (1.753 m), weight 186 lb 14.4 oz (84.8 kg), SpO2 99 %. General appearance: alert and cooperative Throat: lips, mucosa, and tongue normal; teeth and gums normal Neck: no adenopathy Back: negative Resp: clear to auscultation bilaterally Chest wall: no tenderness Cardio: regular rate and rhythm, S1, S2 normal, no murmur, click, rub or gallop GI: soft, non-tender; bowel sounds normal; no masses,  no organomegaly Extremities: extremities normal, atraumatic, no cyanosis or edema Skin: Skin color, texture, turgor normal. No rashes or lesions  CBC    Component Value Date/Time   WBC 3.8 (L) 06/25/2017 1400   RBC 3.97 (L) 06/25/2017 1400   HGB 12.0 (L) 06/25/2017 1400   HCT 38.0 (L) 06/25/2017 1400   PLT 223 06/25/2017 1400   MCV 95.7 06/25/2017 1400   MCV 88.7 10/23/2011 1734   MCH 30.2 06/25/2017 1400   MCHC 31.6 06/25/2017 1400   RDW 15.5 06/25/2017 1400   LYMPHSABS 2.5 11/26/2016 2328   MONOABS 0.4 11/26/2016 2328   EOSABS 0.2 11/26/2016 2328   BASOSABS 0.0 11/26/2016 2328     Chemistry      Component Value Date/Time   NA 143 06/25/2017 1400   K 3.4 (L) 06/25/2017 1400   CL 106 06/25/2017 1400   CO2 29 06/25/2017 1400   BUN 8 06/25/2017 1400   CREATININE 0.69 06/25/2017 1400      Component Value Date/Time   CALCIUM 8.9 06/25/2017 1400   ALKPHOS 61 06/25/2017 1400   AST 46 (H) 06/25/2017 1400   ALT 38  06/25/2017 1400   BILITOT 0.6 06/25/2017 1400       Ct Head Wo Contrast  Result Date: 06/25/2017 CLINICAL DATA:  66 year old male with altered level of consciousness. EXAM: CT HEAD WITHOUT CONTRAST TECHNIQUE: Contiguous axial images were obtained from the base of the skull through the vertex without intravenous contrast. COMPARISON:  11/26/2016 CT FINDINGS: Brain: No evidence of acute infarction, hemorrhage, hydrocephalus, extra-axial collection or mass lesion/mass effect. Atrophy and mild chronic small-vessel white matter ischemic changes noted. Vascular: Intracranial atherosclerotic calcifications identified. Skull: Normal. Negative for fracture or focal lesion. Sinuses/Orbits: No acute finding.  Other: None IMPRESSION: 1. No evidence of acute intracranial abnormality 2. Atrophy and mild chronic small-vessel white matter ischemic changes Electronically Signed   By: Margarette Canada M.D.   On: 06/25/2017 17:41   Ct Chest W Contrast  Result Date: 06/17/2017 CLINICAL DATA:  Weight loss EXAM: CT CHEST, ABDOMEN, AND PELVIS WITH CONTRAST TECHNIQUE: Multidetector CT imaging of the chest, abdomen and pelvis was performed following the standard protocol during bolus administration of intravenous contrast. CONTRAST:  16m ISOVUE-300 IOPAMIDOL (ISOVUE-300) INJECTION 61% COMPARISON:  None. FINDINGS: CT CHEST FINDINGS Cardiovascular: Nonaneurysmal aorta. Atherosclerotic calcifications. Coronary artery calcification. Normal heart size. No pericardial effusion. Mediastinum/Nodes: Multiple hypodense nodules in the right lobe of the thyroid, measuring up to 2.5 cm in the lower pole on the right. Midline trachea. No significant adenopathy. Mild circumferential wall thickening of the distal esophagus. Lungs/Pleura: Lungs are clear. No pleural effusion or pneumothorax. Musculoskeletal: Degenerative changes of the spine. No acute or suspicious bone lesion. CT ABDOMEN PELVIS FINDINGS Hepatobiliary: No focal liver abnormality  is seen. No gallstones, gallbladder wall thickening, or biliary dilatation. Pancreas: Unremarkable. No pancreatic ductal dilatation or surrounding inflammatory changes. Spleen: Normal in size without focal abnormality. Small accessory splenule. Adrenals/Urinary Tract: Adrenal glands are unremarkable. Kidneys are normal, without renal calculi, focal lesion, or hydronephrosis. Bladder is unremarkable. Stomach/Bowel: Stomach is within normal limits. Appendix appears normal. No evidence of bowel wall thickening, distention, or inflammatory changes. Sigmoid colon diverticula without acute inflammation Vascular/Lymphatic: Aortic atherosclerosis. No enlarged abdominal or pelvic lymph nodes. Reproductive: Prostate gland is slightly enlarged. Other: Negative for free air or free fluid. Moderate fat containing right inguinal hernia. Musculoskeletal: No acute or suspicious bone lesions. IMPRESSION: 1. No CT evidence for acute intrathoracic, abdominal, or pelvic abnormality 2. Multiple hypodense thyroid nodules in the right lobe; correlation with thyroid ultrasound as clinically indicated 3. Mild circumferential wall thickening of the distal esophagus, query esophagitis or reflux. Could correlate with endoscopy if felt clinically appropriate 4. Sigmoid colon diverticula without acute inflammation 5. Enlarged prostate gland for which clinical correlation is recommended 6. Moderate right inguinal hernia containing fat Electronically Signed   By: KDonavan FoilM.D.   On: 06/17/2017 16:21   Ct Abdomen Pelvis W Contrast  Result Date: 06/17/2017 CLINICAL DATA:  Weight loss EXAM: CT CHEST, ABDOMEN, AND PELVIS WITH CONTRAST TECHNIQUE: Multidetector CT imaging of the chest, abdomen and pelvis was performed following the standard protocol during bolus administration of intravenous contrast. CONTRAST:  1094mISOVUE-300 IOPAMIDOL (ISOVUE-300) INJECTION 61% COMPARISON:  None. FINDINGS: CT CHEST FINDINGS Cardiovascular: Nonaneurysmal  aorta. Atherosclerotic calcifications. Coronary artery calcification. Normal heart size. No pericardial effusion. Mediastinum/Nodes: Multiple hypodense nodules in the right lobe of the thyroid, measuring up to 2.5 cm in the lower pole on the right. Midline trachea. No significant adenopathy. Mild circumferential wall thickening of the distal esophagus. Lungs/Pleura: Lungs are clear. No pleural effusion or pneumothorax. Musculoskeletal: Degenerative changes of the spine. No acute or suspicious bone lesion. CT ABDOMEN PELVIS FINDINGS Hepatobiliary: No focal liver abnormality is seen. No gallstones, gallbladder wall thickening, or biliary dilatation. Pancreas: Unremarkable. No pancreatic ductal dilatation or surrounding inflammatory changes. Spleen: Normal in size without focal abnormality. Small accessory splenule. Adrenals/Urinary Tract: Adrenal glands are unremarkable. Kidneys are normal, without renal calculi, focal lesion, or hydronephrosis. Bladder is unremarkable. Stomach/Bowel: Stomach is within normal limits. Appendix appears normal. No evidence of bowel wall thickening, distention, or inflammatory changes. Sigmoid colon diverticula without acute inflammation Vascular/Lymphatic: Aortic atherosclerosis. No enlarged abdominal or pelvic lymph nodes. Reproductive:  Prostate gland is slightly enlarged. Other: Negative for free air or free fluid. Moderate fat containing right inguinal hernia. Musculoskeletal: No acute or suspicious bone lesions. IMPRESSION: 1. No CT evidence for acute intrathoracic, abdominal, or pelvic abnormality 2. Multiple hypodense thyroid nodules in the right lobe; correlation with thyroid ultrasound as clinically indicated 3. Mild circumferential wall thickening of the distal esophagus, query esophagitis or reflux. Could correlate with endoscopy if felt clinically appropriate 4. Sigmoid colon diverticula without acute inflammation 5. Enlarged prostate gland for which clinical correlation is  recommended 6. Moderate right inguinal hernia containing fat Electronically Signed   By: Donavan Foil M.D.   On: 06/17/2017 16:21   US Thyroid  Result Date: 07/08/2017 CLINICAL DATA:  Incidental on CT. EXAM: THYROID ULTRASOUND TECHNIQUE: Ultrasound examination of the thyroid gland and adjacent soft tissues was performed. COMPARISON:  None. FINDINGS: Parenchymal Echotexture: Mildly heterogenous Isthmus: 0.7 cm Right lobe: 3.9 x 1.3 x 1.5 cm Left lobe: 4.4 x 1.7 x 1.8 cm _________________________________________________________ Estimated total number of nodules >/= 1 cm: 3 Number of spongiform nodules >/=  2 cm not described below (TR1): 0 Number of mixed cystic and solid nodules >/= 1.5 cm not described below ([place]): 0 _________________________________________________________ Nodule # 1: Location: Right; Inferior Maximum size: 0.9 cm; Other 2 dimensions: 0.8 x 0.6 cm Composition: solid/almost completely solid (2) Echogenicity: hypoechoic (2) Shape: taller-than-wide (3) Margins: smooth (0) Echogenic foci: none (0) ACR TI-RADS total points: 7. ACR TI-RADS risk category: TR5 (>/= 7 points). ACR TI-RADS recommendations: *Given size (>/= 0.5 - 0.9 cm) and appearance, a follow-up ultrasound in 1 year should be considered based on TI-RADS criteria. _________________________________________________________ Nodule # 2: Location: Right; Inferior Maximum size: 1.1 cm; Other 2 dimensions: 0.8 x 0.7 cm Composition: solid/almost completely solid (2) Echogenicity: hypoechoic (2) Shape: not taller-than-wide (0) Margins: smooth (0) Echogenic foci: none (0) ACR TI-RADS total points: 4. ACR TI-RADS risk category: TR4 (4-6 points). ACR TI-RADS recommendations: *Given size (>/= 1 - 1.4 cm) and appearance, a follow-up ultrasound in 1 year should be considered based on TI-RADS criteria. _________________________________________________________ Nodule # 3: Location: Right; Inferior Maximum size: 1.6 cm; Other 2 dimensions: 0.6 x 0.6 cm  Composition: solid/almost completely solid (2) Echogenicity: very hypoechoic (3) Shape: not taller-than-wide (0) Margins: smooth (0) Echogenic foci: none (0) ACR TI-RADS total points: 5. ACR TI-RADS risk category: TR4 (4-6 points). ACR TI-RADS recommendations: **Given size (>/= 1.5 cm) and appearance, fine needle aspiration of this moderately suspicious nodule should be considered based on TI-RADS criteria. _________________________________________________________ Nodule # 6: Location: Left; Superior Maximum size: 1.1 cm; Other 2 dimensions: 1.0 x 0.8 cm Composition: solid/almost completely solid (2) Echogenicity: hypoechoic (2) Shape: not taller-than-wide (0) Margins: smooth (0) Echogenic foci: none (0) ACR TI-RADS total points: 4. ACR TI-RADS risk category: TR4 (4-6 points). ACR TI-RADS recommendations: *Given size (>/= 1 - 1.4 cm) and appearance, a follow-up ultrasound in 1 year should be considered based on TI-RADS criteria. _________________________________________________________ Multiple other smaller nodules and cysts are present in the gland. They do not meet criteria for biopsy or follow-up. IMPRESSION: Multiple nodules are seen throughout both lobes of the gland. Nodules 1, 2, and 6 meet criteria for annual follow-up. Nodule 3 meets criteria for biopsy. The above is in keeping with the ACR TI-RADS recommendations - J Am Coll Radiol 2017;14:587-595. Electronically Signed   By: Marybelle Killings M.D.   On: 07/08/2017 16:55    Assessment and Plan:   66 year old gentleman with the following  1.  Evaluation for plasma cell disorder: He was found to have an elevated total protein in October 2018 at that time his total protein was 8.5 and a calcium of 10.5.  His total bilirubin was 1.5.  His AST is also was 136 and ALT 70.  He also had a sedimentation rate of 28.  The differential diagnosis for an abnormal serum protein electrophoresis was discussed today with the patient.  These findings most likely  represent reactive to other etiologies.  Plasma cell disorder such as MGUS, multiple myeloma, amyloidosis among others are considered less likely.  For completeness, I will repeat a serum protein electrophoresis as well as serum light chains.  If the pattern is consistent with a polyclonal gammopathy, no further intervention is needed.  If the repeat laboratory testing indicate a monoclonal gammopathy then continued observation and surveillance is warranted.  I doubt a diagnosis of multiple myeloma at this time.  2.  Weight loss: He does not appear to be related to malignancy.  He had a thorough evaluation without any evidence to suggest malignancy.   3.  Elevation liver function tests: He had an ultrasound which showed diffuse hepatocellular disease although no mass identified.  CT scan did not show any discrete hepatic masses.  4.  Follow-up: Will be determined by the results of his repeat laboratory testing.

## 2017-07-15 NOTE — Telephone Encounter (Signed)
Gave avs and calendar for November  °

## 2017-07-16 LAB — KAPPA/LAMBDA LIGHT CHAINS
IG KAPPA FREE LIGHT CHAIN: 23.1 mg/L — AB (ref 3.3–19.4)
IG LAMBDA FREE LIGHT CHAIN: 13.4 mg/L (ref 5.7–26.3)
KAPPA/LAMBDA FLC RATIO: 1.72 — AB (ref 0.26–1.65)

## 2017-07-17 LAB — MULTIPLE MYELOMA PANEL, SERUM
ALBUMIN SERPL ELPH-MCNC: 4.1 g/dL (ref 2.9–4.4)
Albumin/Glob SerPl: 1 (ref 0.7–1.7)
Alpha 1: 0.2 g/dL (ref 0.0–0.4)
Alpha2 Glob SerPl Elph-Mcnc: 0.7 g/dL (ref 0.4–1.0)
B-GLOBULIN SERPL ELPH-MCNC: 1.3 g/dL (ref 0.7–1.3)
Gamma Glob SerPl Elph-Mcnc: 2 g/dL — ABNORMAL HIGH (ref 0.4–1.8)
Globulin, Total: 4.2 g/dL — ABNORMAL HIGH (ref 2.2–3.9)
IGM (IMMUNOGLOBIN M), SRM: 150 mg/dL (ref 20–172)
IgA, Qn, Serum: 412 mg/dL (ref 61–437)
M Protein SerPl Elph-Mcnc: 1.2 g/dL — ABNORMAL HIGH
TOTAL PROTEIN: 8.3 g/dL (ref 6.0–8.5)

## 2017-07-18 ENCOUNTER — Other Ambulatory Visit: Payer: Self-pay | Admitting: Oncology

## 2017-07-18 DIAGNOSIS — D472 Monoclonal gammopathy: Secondary | ICD-10-CM

## 2017-07-23 ENCOUNTER — Telehealth: Payer: Self-pay

## 2017-07-23 NOTE — Telephone Encounter (Signed)
Called patient to verify upcoming appointment for 01/21/18 @9am . Per sch message will mail confirmation letter and calender.

## 2017-12-08 ENCOUNTER — Other Ambulatory Visit: Payer: Self-pay | Admitting: Endocrinology

## 2017-12-08 DIAGNOSIS — E041 Nontoxic single thyroid nodule: Secondary | ICD-10-CM

## 2017-12-10 ENCOUNTER — Other Ambulatory Visit: Payer: Self-pay | Admitting: Endocrinology

## 2017-12-10 ENCOUNTER — Ambulatory Visit
Admission: RE | Admit: 2017-12-10 | Discharge: 2017-12-10 | Disposition: A | Discharge status: Home / Self Care | Payer: BLUE CROSS/BLUE SHIELD | Source: Ambulatory Visit | Attending: Endocrinology | Admitting: Endocrinology

## 2017-12-10 DIAGNOSIS — E041 Nontoxic single thyroid nodule: Secondary | ICD-10-CM

## 2018-01-21 ENCOUNTER — Inpatient Hospital Stay: Payer: BLUE CROSS/BLUE SHIELD | Attending: Oncology | Admitting: Oncology

## 2018-01-21 ENCOUNTER — Inpatient Hospital Stay: Payer: BLUE CROSS/BLUE SHIELD

## 2018-01-21 ENCOUNTER — Telehealth: Payer: Self-pay | Admitting: Oncology

## 2018-01-21 VITALS — BP 167/102 | HR 76 | Temp 98.5°F | Resp 18 | Ht 69.0 in | Wt 187.1 lb

## 2018-01-21 DIAGNOSIS — R740 Nonspecific elevation of levels of transaminase and lactic acid dehydrogenase [LDH]: Secondary | ICD-10-CM

## 2018-01-21 DIAGNOSIS — D472 Monoclonal gammopathy: Secondary | ICD-10-CM

## 2018-01-21 LAB — CBC WITH DIFFERENTIAL/PLATELET
Basophils Absolute: 0 10*3/uL (ref 0.0–0.1)
Basophils Relative: 1 %
EOS ABS: 0 10*3/uL (ref 0.0–0.5)
EOS PCT: 1 %
HCT: 39.1 % (ref 38.4–49.9)
Hemoglobin: 13 g/dL (ref 13.0–17.1)
LYMPHS ABS: 1.7 10*3/uL (ref 0.9–3.3)
LYMPHS PCT: 46 %
MCH: 30.8 pg (ref 27.2–33.4)
MCHC: 33.3 g/dL (ref 32.0–36.0)
MCV: 92.7 fL (ref 79.3–98.0)
MONO ABS: 0.3 10*3/uL (ref 0.1–0.9)
Monocytes Relative: 8 %
Neutro Abs: 1.6 10*3/uL (ref 1.5–6.5)
Neutrophils Relative %: 44 %
Platelets: 141 10*3/uL (ref 140–400)
RBC: 4.22 MIL/uL (ref 4.20–5.82)
RDW: 12.6 % (ref 11.0–14.6)
WBC: 3.6 10*3/uL — ABNORMAL LOW (ref 4.0–10.3)

## 2018-01-21 LAB — COMPREHENSIVE METABOLIC PANEL
ALBUMIN: 4.3 g/dL (ref 3.5–5.0)
ALT: 51 U/L (ref 0–55)
AST: 52 U/L — AB (ref 5–34)
Alkaline Phosphatase: 76 U/L (ref 40–150)
Anion gap: 11 (ref 3–11)
BILIRUBIN TOTAL: 0.9 mg/dL (ref 0.2–1.2)
BUN: 13 mg/dL (ref 7–26)
CHLORIDE: 100 mmol/L (ref 98–109)
CO2: 30 mmol/L — ABNORMAL HIGH (ref 22–29)
CREATININE: 0.76 mg/dL (ref 0.70–1.30)
Calcium: 9 mg/dL (ref 8.4–10.4)
GFR calc Af Amer: >60 mL/min (ref >60)
GFR calc non Af Amer: >60 mL/min (ref >60)
GLUCOSE: 125 mg/dL (ref 70–140)
Potassium: 3.5 mmol/L (ref 3.5–5.1)
Sodium: 141 mmol/L (ref 136–145)
Total Protein: 8.4 g/dL — ABNORMAL HIGH (ref 6.4–8.3)

## 2018-01-21 NOTE — Telephone Encounter (Signed)
Scheduled appt per 5/22 los - Gave patient aVS and calender per los.  

## 2018-01-21 NOTE — Progress Notes (Signed)
Hematology and Oncology Follow Up Visit  Ryan Rosales [id number] May 22, 1951 67 y.o. 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  He was found to have M spike of 1.2 g/dL and IgG level of 1790.  No endorgan damage to suggest active multiple myeloma.   Current therapy: Active surveillance.  Interim History: Ryan Rosales presents today for a follow-up visit.  Since the last visit, he reports no major changes in his health.  His appetite has been reasonable and have maintained his weight.  He denies any further weight loss or changes in his appetite.  He denies any bone pain or pathological fractures.  He denies any opportunistic infections.  Denies excessive fatigue or tiredness.  He does not report any headaches, blurry vision, syncope or seizures. Does not report any fevers, chills or sweats.  Does not report any cough, wheezing or hemoptysis.  Does not report any chest pain, palpitation, orthopnea or leg edema.  Does not report any nausea, vomiting or abdominal pain.  Does not report any constipation or diarrhea.  Does not report any skeletal complaints.    Does not report frequency, urgency or hematuria.  Does not report any skin rashes or lesions.  Does not report any lymphadenopathy or petechiae.  Does not report any anxiety or depression.  Remaining review of systems is negative.    Medications: I have reviewed the patient's current medications.  Current Outpatient Medications  Medication Sig Dispense Refill  . amLODipine (NORVASC) 5 MG tablet Take 5 mg by mouth daily.    . B Complex-C (B-COMPLEX WITH VITAMIN C) tablet Take 1 tablet by mouth daily.    . cetirizine (ZYRTEC) 10 MG tablet Take 1 tablet (10 mg total) by mouth daily. (Patient not taking: Reported on 11/26/2016) 30 tablet 11  . cholecalciferol (VITAMIN D) 1000 units tablet Take 1,000 Units by mouth daily.    . cyclobenzaprine (FLEXERIL) 10  MG tablet Take 1 tablet (10 mg total) by mouth at bedtime as needed for muscle spasms. (Patient not taking: Reported on 11/26/2016) 30 tablet 0  . EPINEPHrine (EPIPEN) 0.3 mg/0.3 mL DEVI Inject 0.3 mLs (0.3 mg total) into the muscle once. 1 Device 2  . famotidine (PEPCID) 40 MG tablet Take 1 tablet (40 mg total) by mouth daily. (Patient not taking: Reported on 11/26/2016) 30 tablet 0  . ferrous sulfate 325 (65 FE) MG tablet Take 325 mg by mouth daily with breakfast.    . losartan (COZAAR) 100 MG tablet Take 100 mg by mouth daily.  3  . losartan (COZAAR) 50 MG tablet Take 50 mg by mouth daily.  3  . Multiple Vitamin (MULTIVITAMIN) capsule Take 1 capsule by mouth daily.    . Omega-3 Fatty Acids (FISH OIL PO) Take 1 capsule by mouth 2 (two) times daily.    . ranitidine (ZANTAC) 150 MG tablet Take 150 mg by mouth daily.      No current facility-administered medications for this visit.      Allergies:  Allergies  Allergen Reactions  . Lisinopril Swelling    Past Medical History, Surgical history, Social history, and Family History were reviewed and updated.  Review of Systems:  Remaining ROS negative.  Physical Exam: Blood pressure (!) 167/102, pulse 76, temperature 98.5 F (36.9 C), temperature source Oral, resp. rate 18, height 5' 9"  (1.753 m), weight 187 lb 1.6 oz (84.9 kg), SpO2 100 %. ECOG: 0 General appearance: alert and  cooperative appeared without distress. Head: Normocephalic, without obvious abnormality Oropharynx: No oral thrush or ulcers. Eyes: No scleral icterus.  Pupils are equal and round reactive to light. Lymph nodes: Cervical, supraclavicular, and axillary nodes normal. Heart:regular rate and rhythm, S1, S2 normal, no murmur, click, rub or gallop Lung:chest clear, no wheezing, rales, normal symmetric air entry Abdomin: soft, non-tender, without masses or organomegaly. Neurological: No motor, sensory deficits.  Intact deep tendon reflexes. Skin: No rashes or lesions.   No ecchymosis or petechiae. Musculoskeletal: No joint deformity or effusion. Psychiatric: Mood and affect are appropriate.    Lab Results: Lab Results  Component Value Date   WBC 3.6 (L) 01/21/2018   HGB 13.0 01/21/2018   HCT 39.1 01/21/2018   MCV 92.7 01/21/2018   PLT 141 01/21/2018     Chemistry      Component Value Date/Time   NA 143 07/15/2017 1121   K 3.7 07/15/2017 1121   CL 106 06/25/2017 1400   CO2 26 07/15/2017 1121   BUN 15.5 07/15/2017 1121   CREATININE 0.8 07/15/2017 1121      Component Value Date/Time   CALCIUM 9.6 07/15/2017 1121   ALKPHOS 74 07/15/2017 1121   AST 55 (H) 07/15/2017 1121   ALT 46 07/15/2017 1121   BILITOT 0.92 07/15/2017 1121     Results for Ryan Rosales, Ryan Rosales (MRN [id number]) as of 01/21/2018 09:08  Ref. Range 07/15/2017 11:21  Albumin SerPl Elph-Mcnc Latest Ref Range: 2.9 - 4.4 g/dL 4.1  Albumin/Glob SerPl Latest Ref Range: 0.7 - 1.7  1.0  Alpha2 Glob SerPl Elph-Mcnc Latest Ref Range: 0.4 - 1.0 g/dL 0.7  Alpha 1 Latest Ref Range: 0.0 - 0.4 g/dL 0.2  Gamma Glob SerPl Elph-Mcnc Latest Ref Range: 0.4 - 1.8 g/dL 2.0 (H)  M Protein SerPl Elph-Mcnc Latest Ref Range: Not Observed g/dL 1.2 (H)  IFE 1 Unknown Comment  Globulin, Total Latest Ref Range: 2.2 - 3.9 g/dL 4.2 (H)  B-Globulin SerPl Elph-Mcnc Latest Ref Range: 0.7 - 1.3 g/dL 1.3  IgG (Immunoglobin G), Serum Latest Ref Range: 700 - 1600 mg/dL 1,790 (H)  IgM, Qn, Serum Latest Ref Range: 20 - 172 mg/dL 150     Impression and Plan:  67 year old gentleman with the following issues:  1.  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  His M spike is around 1 g/dL and his IgG level is 1719.  He has no evidence of endorgan damage with normal creatinine, calcium, CBC and imaging studies showed no bone lesions.  The natural course of this disease was reviewed today with the patient extensively.  He appears to be asymptomatic from these findings and the likelihood of progression into multiple myeloma  is estimated to be close to 1 %/year.  I have recommended continued observation and surveillance at this time and repeat protein studies every 6 to 9 months.  We will consider a bone marrow biopsy and skeletal survey if any abnormalities in his blood noted.  2.  Weight loss: His weight has been stable since the last visit.  I do not think this is related to a plasma cell disorder.  3.  Elevated liver function tests: His AST is still mildly elevated at 55 but his ALT, alkaline phosphatase and bilirubin is within normal range.  I do not see any clear-cut correlation tween this and a plasma cell disorder.  4.  Follow-up: We will be in 9 months to follow his progress.  15  minutes was spent with the patient face-to-face today.  More than 50% of time was dedicated to patient counseling, education and answering questions regarding diagnosis, prognosis and future plan of care.   Zola Button, MD 5/22/20199:43 AM

## 2018-01-22 LAB — KAPPA/LAMBDA LIGHT CHAINS
Kappa free light chain: 26 mg/L — ABNORMAL HIGH (ref 3.3–19.4)
Kappa, lambda light chain ratio: 2.11 — ABNORMAL HIGH (ref 0.26–1.65)
Lambda free light chains: 12.3 mg/L (ref 5.7–26.3)

## 2018-01-27 LAB — MULTIPLE MYELOMA PANEL, SERUM
ALBUMIN/GLOB SERPL: 1.1 (ref 0.7–1.7)
ALPHA 1: 0.2 g/dL (ref 0.0–0.4)
ALPHA2 GLOB SERPL ELPH-MCNC: 0.7 g/dL (ref 0.4–1.0)
Albumin SerPl Elph-Mcnc: 3.9 g/dL (ref 2.9–4.4)
B-Globulin SerPl Elph-Mcnc: 1.1 g/dL (ref 0.7–1.3)
GAMMA GLOB SERPL ELPH-MCNC: 1.8 g/dL (ref 0.4–1.8)
GLOBULIN, TOTAL: 3.7 g/dL (ref 2.2–3.9)
IGA: 434 mg/dL (ref 61–437)
IgG (Immunoglobin G), Serum: 1861 mg/dL — ABNORMAL HIGH (ref 700–1600)
IgM (Immunoglobulin M), Srm: 150 mg/dL (ref 20–172)
M Protein SerPl Elph-Mcnc: 1.1 g/dL — ABNORMAL HIGH
Total Protein ELP: 7.6 g/dL (ref 6.0–8.5)

## 2018-03-07 IMAGING — US US THYROID
1 series · 12 of 25 positions shown · non-contrast
Comparison: None.

CLINICAL DATA: Incidental on CT.

EXAM:
THYROID ULTRASOUND
TECHNIQUE: Ultrasound examination of the thyroid gland and adjacent soft
tissues was performed.

[Series 1: us thyroid · 0.08mm/px · 12 of 77 slices shown]
[im 4/77]
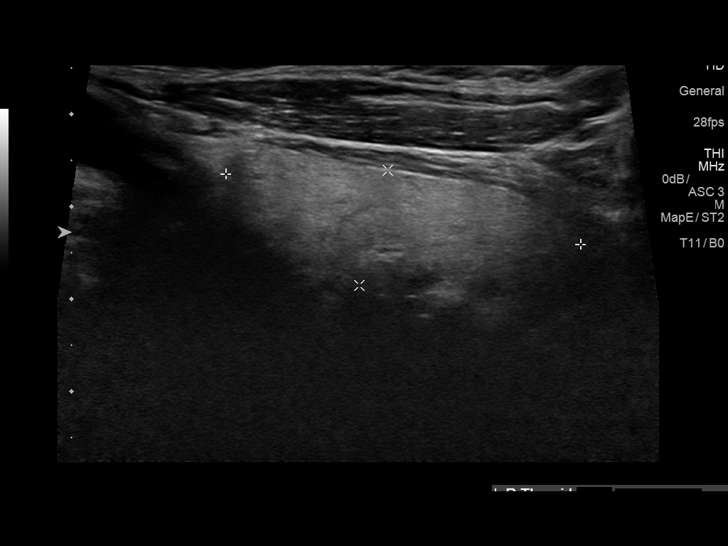
[im 10/77]
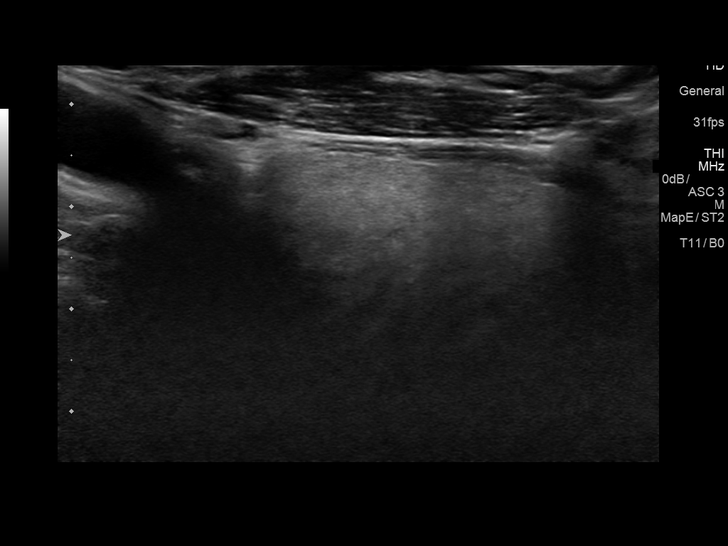
[im 16/77]
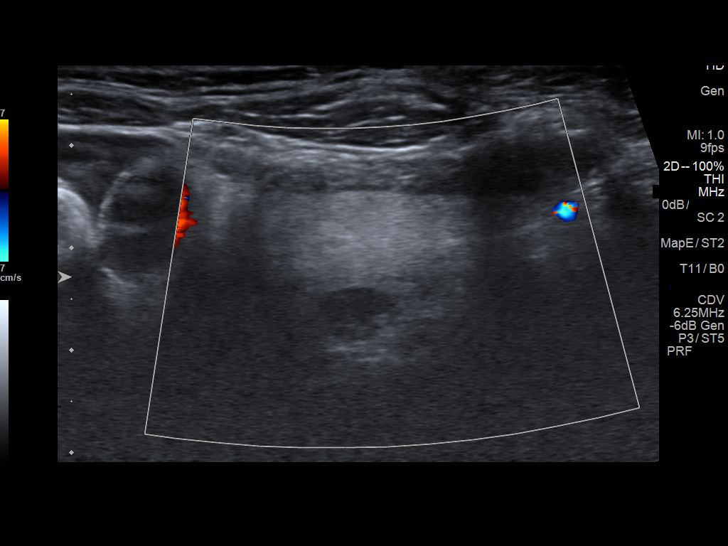
[im 23/77]
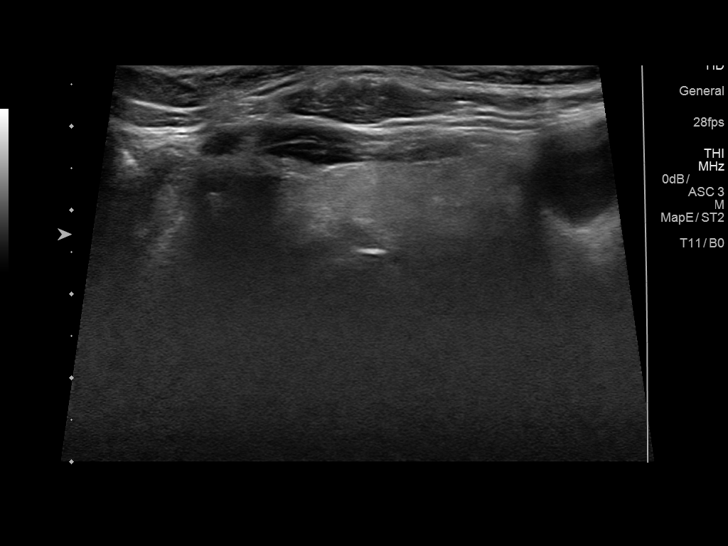
[im 29/77]
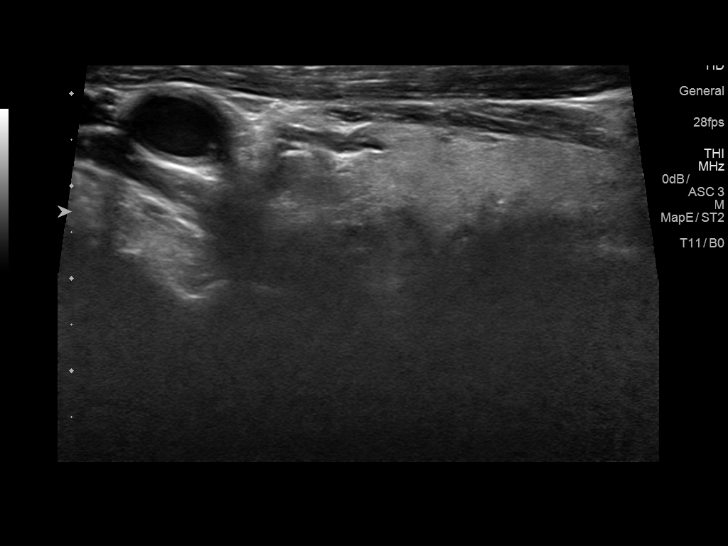
[im 35/77]
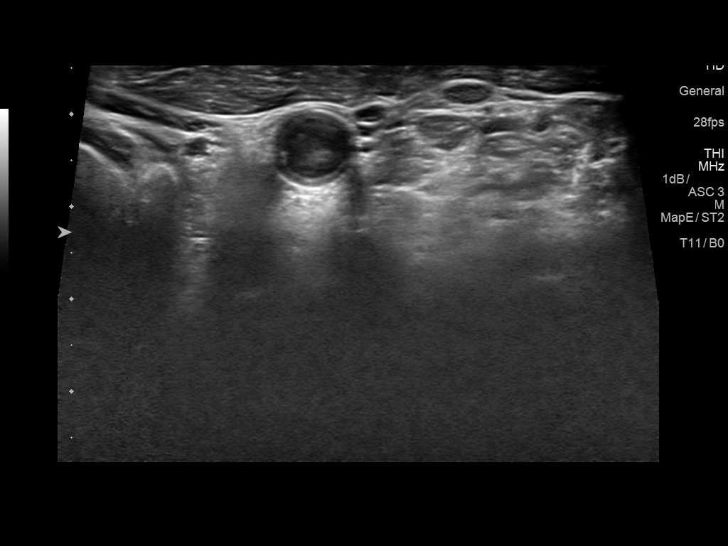
[im 42/77]
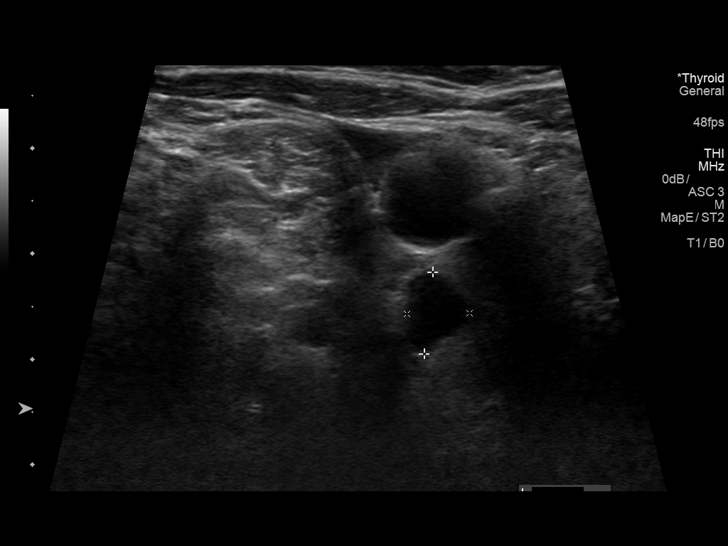
[im 48/77]
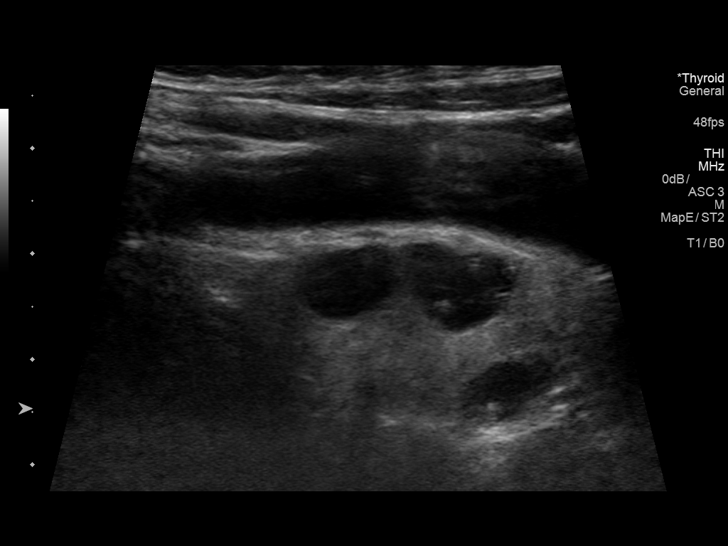
[im 54/77]
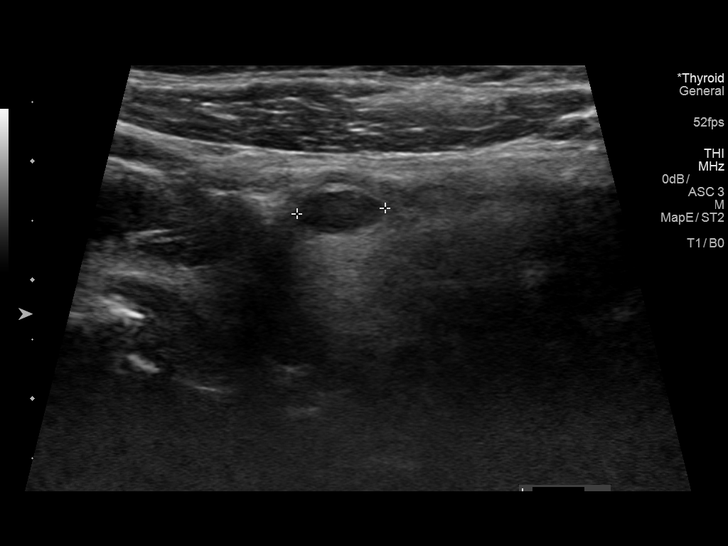
[im 61/77]
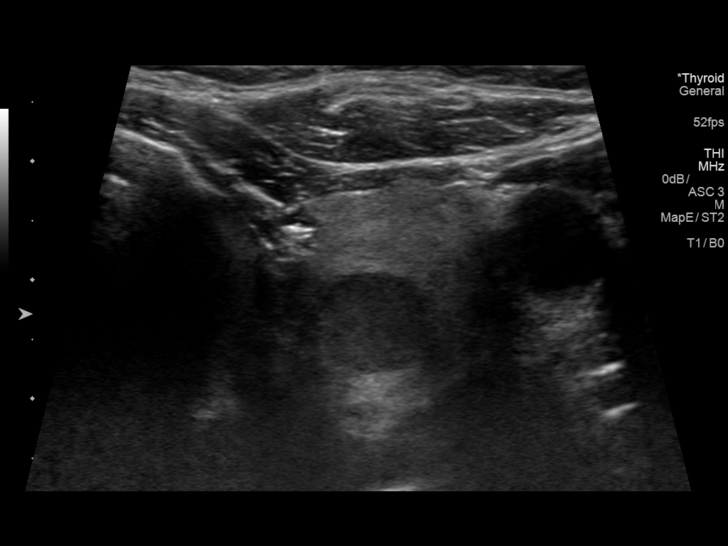
[im 67/77]
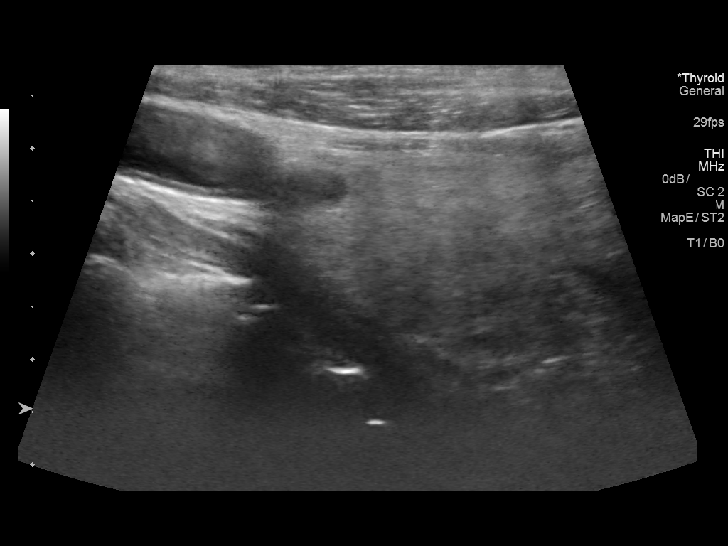
[im 73/77]
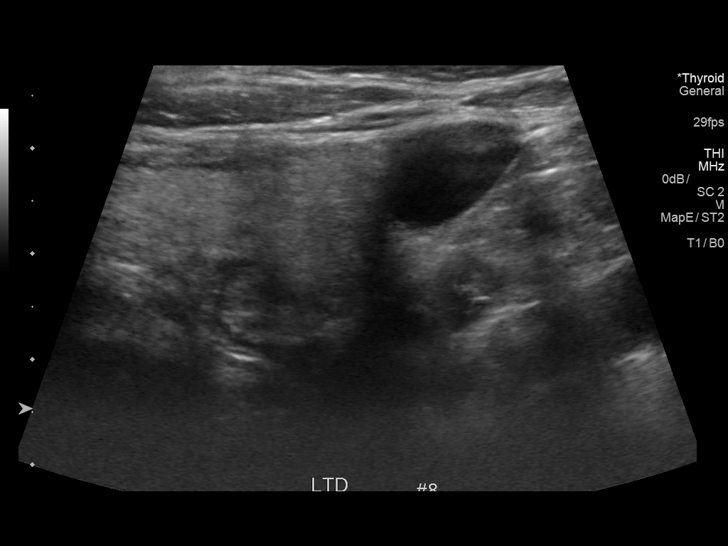

[12 of 25 positions shown; findings below may reference images not displayed]

FINDINGS: Parenchymal Echotexture: Mildly heterogenous

Isthmus: 0.7 cm

Right lobe: 3.9 x 1.3 x 1.5 cm

Left lobe: 4.4 x 1.7 x 1.8 cm

_________________________________________________________

Estimated total number of nodules >/= 1 cm: 3

Number of spongiform nodules >/=  2 cm not described below (TR1): 0

Number of mixed cystic and solid nodules >/= 1.5 cm not described
below (TR2): 0

_________________________________________________________

Nodule # 1:

Location: Right; Inferior

Maximum size: 0.9 cm; Other 2 dimensions: 0.8 x 0.6 cm

Composition: solid/almost completely solid (2)

Echogenicity: hypoechoic (2)

Shape: taller-than-wide (3)

Margins: smooth (0)

Echogenic foci: none (0)

ACR TI-RADS total points: 7.

ACR TI-RADS risk category: TR5 (>/= 7 points).

ACR TI-RADS recommendations:

*Given size (>/= 0.5 - 0.9 cm) and appearance, a follow-up
ultrasound in 1 year should be considered based on TI-RADS criteria.

_________________________________________________________

Nodule # 2:

Location: Right; Inferior

Maximum size: 1.1 cm; Other 2 dimensions: 0.8 x 0.7 cm

Composition: solid/almost completely solid (2)

Echogenicity: hypoechoic (2)

Shape: not taller-than-wide (0)

Margins: smooth (0)

Echogenic foci: none (0)

ACR TI-RADS total points: 4.

ACR TI-RADS risk category: TR4 (4-6 points).

ACR TI-RADS recommendations:

*Given size (>/= 1 - 1.4 cm) and appearance, a follow-up ultrasound
in 1 year should be considered based on TI-RADS criteria.

_________________________________________________________

Nodule # 3:

Location: Right; Inferior

Maximum size: 1.6 cm; Other 2 dimensions: 0.6 x 0.6 cm

Composition: solid/almost completely solid (2)

Echogenicity: very hypoechoic (3)

Shape: not taller-than-wide (0)

Margins: smooth (0)

Echogenic foci: none (0)

ACR TI-RADS total points: 5.

ACR TI-RADS risk category: TR4 (4-6 points).

ACR TI-RADS recommendations:

**Given size (>/= 1.5 cm) and appearance, fine needle aspiration of
this moderately suspicious nodule should be considered based on
TI-RADS criteria.

_________________________________________________________

Nodule # 6:

Location: Left; Superior

Maximum size: 1.1 cm; Other 2 dimensions: 1.0 x 0.8 cm

Composition: solid/almost completely solid (2)

Echogenicity: hypoechoic (2)

Shape: not taller-than-wide (0)

Margins: smooth (0)

Echogenic foci: none (0)

ACR TI-RADS total points: 4.

ACR TI-RADS risk category: TR4 (4-6 points).

ACR TI-RADS recommendations:

*Given size (>/= 1 - 1.4 cm) and appearance, a follow-up ultrasound
in 1 year should be considered based on TI-RADS criteria.

_________________________________________________________

Multiple other smaller nodules and cysts are present in the gland.
They do not meet criteria for biopsy or follow-up.
IMPRESSION: Multiple nodules are seen throughout both lobes of the gland.

Nodules 1, 2, and 6 meet criteria for annual follow-up.

Nodule 3 meets criteria for biopsy.

The above is in keeping with the ACR TI-RADS recommendations - [HOSPITAL] 9052;[DATE].

## 2018-07-15 ENCOUNTER — Other Ambulatory Visit: Payer: Self-pay | Admitting: Family Medicine

## 2018-07-15 DIAGNOSIS — E041 Nontoxic single thyroid nodule: Secondary | ICD-10-CM

## 2018-07-28 ENCOUNTER — Ambulatory Visit
Admission: RE | Admit: 2018-07-28 | Discharge: 2018-07-28 | Disposition: A | Discharge status: Home / Self Care | Payer: Medicare Other | Source: Ambulatory Visit | Attending: Family Medicine | Admitting: Family Medicine

## 2018-07-28 DIAGNOSIS — E041 Nontoxic single thyroid nodule: Secondary | ICD-10-CM

## 2018-07-28 DIAGNOSIS — E042 Nontoxic multinodular goiter: Secondary | ICD-10-CM | POA: Diagnosis not present

## 2018-08-03 DIAGNOSIS — H2512 Age-related nuclear cataract, left eye: Secondary | ICD-10-CM | POA: Diagnosis not present

## 2018-08-03 DIAGNOSIS — H25013 Cortical age-related cataract, bilateral: Secondary | ICD-10-CM | POA: Diagnosis not present

## 2018-08-03 DIAGNOSIS — H2513 Age-related nuclear cataract, bilateral: Secondary | ICD-10-CM | POA: Diagnosis not present

## 2018-08-03 DIAGNOSIS — H25043 Posterior subcapsular polar age-related cataract, bilateral: Secondary | ICD-10-CM | POA: Diagnosis not present

## 2018-08-03 DIAGNOSIS — H268 Other specified cataract: Secondary | ICD-10-CM | POA: Diagnosis not present

## 2018-08-03 DIAGNOSIS — H25012 Cortical age-related cataract, left eye: Secondary | ICD-10-CM | POA: Diagnosis not present

## 2018-08-03 DIAGNOSIS — H35033 Hypertensive retinopathy, bilateral: Secondary | ICD-10-CM | POA: Diagnosis not present

## 2018-08-12 DIAGNOSIS — H2512 Age-related nuclear cataract, left eye: Secondary | ICD-10-CM | POA: Diagnosis not present

## 2018-08-12 DIAGNOSIS — H25812 Combined forms of age-related cataract, left eye: Secondary | ICD-10-CM | POA: Diagnosis not present

## 2018-08-18 DIAGNOSIS — H2511 Age-related nuclear cataract, right eye: Secondary | ICD-10-CM | POA: Diagnosis not present

## 2018-08-18 DIAGNOSIS — H25011 Cortical age-related cataract, right eye: Secondary | ICD-10-CM | POA: Diagnosis not present

## 2018-08-18 DIAGNOSIS — H25041 Posterior subcapsular polar age-related cataract, right eye: Secondary | ICD-10-CM | POA: Diagnosis not present

## 2018-08-18 DIAGNOSIS — H268 Other specified cataract: Secondary | ICD-10-CM | POA: Diagnosis not present

## 2018-09-16 DIAGNOSIS — H2511 Age-related nuclear cataract, right eye: Secondary | ICD-10-CM | POA: Diagnosis not present

## 2018-09-16 DIAGNOSIS — H25811 Combined forms of age-related cataract, right eye: Secondary | ICD-10-CM | POA: Diagnosis not present

## 2018-10-23 ENCOUNTER — Inpatient Hospital Stay: Payer: Medicare Other | Attending: Oncology | Admitting: Oncology

## 2018-10-23 ENCOUNTER — Inpatient Hospital Stay: Payer: Medicare Other

## 2018-11-06 DIAGNOSIS — K7581 Nonalcoholic steatohepatitis (NASH): Secondary | ICD-10-CM | POA: Diagnosis not present

## 2018-11-06 DIAGNOSIS — Z23 Encounter for immunization: Secondary | ICD-10-CM | POA: Diagnosis not present

## 2018-11-06 DIAGNOSIS — E041 Nontoxic single thyroid nodule: Secondary | ICD-10-CM | POA: Diagnosis not present

## 2018-11-06 DIAGNOSIS — R8290 Unspecified abnormal findings in urine: Secondary | ICD-10-CM | POA: Diagnosis not present

## 2018-11-06 DIAGNOSIS — R899 Unspecified abnormal finding in specimens from other organs, systems and tissues: Secondary | ICD-10-CM | POA: Diagnosis not present

## 2018-11-06 DIAGNOSIS — Z0000 Encounter for general adult medical examination without abnormal findings: Secondary | ICD-10-CM | POA: Diagnosis not present

## 2018-11-06 DIAGNOSIS — Z125 Encounter for screening for malignant neoplasm of prostate: Secondary | ICD-10-CM | POA: Diagnosis not present

## 2018-11-06 DIAGNOSIS — Z86010 Personal history of colonic polyps: Secondary | ICD-10-CM | POA: Diagnosis not present

## 2018-11-06 DIAGNOSIS — I10 Essential (primary) hypertension: Secondary | ICD-10-CM | POA: Diagnosis not present

## 2018-11-06 DIAGNOSIS — D472 Monoclonal gammopathy: Secondary | ICD-10-CM | POA: Diagnosis not present

## 2018-11-06 DIAGNOSIS — E782 Mixed hyperlipidemia: Secondary | ICD-10-CM | POA: Diagnosis not present

## 2018-11-06 DIAGNOSIS — E1169 Type 2 diabetes mellitus with other specified complication: Secondary | ICD-10-CM | POA: Diagnosis not present

## 2018-12-03 DIAGNOSIS — E041 Nontoxic single thyroid nodule: Secondary | ICD-10-CM | POA: Diagnosis not present

## 2018-12-04 DIAGNOSIS — E041 Nontoxic single thyroid nodule: Secondary | ICD-10-CM | POA: Diagnosis not present

## 2018-12-16 DIAGNOSIS — R740 Nonspecific elevation of levels of transaminase and lactic acid dehydrogenase [LDH]: Secondary | ICD-10-CM | POA: Diagnosis not present

## 2018-12-16 DIAGNOSIS — E041 Nontoxic single thyroid nodule: Secondary | ICD-10-CM | POA: Diagnosis not present

## 2018-12-16 DIAGNOSIS — R899 Unspecified abnormal finding in specimens from other organs, systems and tissues: Secondary | ICD-10-CM | POA: Diagnosis not present

## 2019-01-04 DIAGNOSIS — K7581 Nonalcoholic steatohepatitis (NASH): Secondary | ICD-10-CM | POA: Diagnosis not present

## 2019-03-10 ENCOUNTER — Other Ambulatory Visit: Payer: Self-pay | Admitting: Endocrinology

## 2019-03-10 DIAGNOSIS — E041 Nontoxic single thyroid nodule: Secondary | ICD-10-CM

## 2019-03-18 ENCOUNTER — Ambulatory Visit
Admission: RE | Admit: 2019-03-18 | Discharge: 2019-03-18 | Disposition: A | Discharge status: Home / Self Care | Payer: Medicare Other | Source: Ambulatory Visit | Attending: Endocrinology | Admitting: Endocrinology

## 2019-03-18 DIAGNOSIS — E041 Nontoxic single thyroid nodule: Secondary | ICD-10-CM

## 2019-04-13 DIAGNOSIS — H40013 Open angle with borderline findings, low risk, bilateral: Secondary | ICD-10-CM | POA: Diagnosis not present

## 2019-04-13 DIAGNOSIS — H35033 Hypertensive retinopathy, bilateral: Secondary | ICD-10-CM | POA: Diagnosis not present

## 2019-04-13 DIAGNOSIS — Z961 Presence of intraocular lens: Secondary | ICD-10-CM | POA: Diagnosis not present

## 2019-04-13 DIAGNOSIS — E119 Type 2 diabetes mellitus without complications: Secondary | ICD-10-CM | POA: Diagnosis not present

## 2019-05-14 DIAGNOSIS — R740 Nonspecific elevation of levels of transaminase and lactic acid dehydrogenase [LDH]: Secondary | ICD-10-CM | POA: Diagnosis not present

## 2019-05-19 DIAGNOSIS — R740 Nonspecific elevation of levels of transaminase and lactic acid dehydrogenase [LDH]: Secondary | ICD-10-CM | POA: Diagnosis not present

## 2019-08-13 ENCOUNTER — Other Ambulatory Visit: Payer: Self-pay | Admitting: Family Medicine

## 2019-08-13 DIAGNOSIS — E041 Nontoxic single thyroid nodule: Secondary | ICD-10-CM

## 2019-08-24 ENCOUNTER — Ambulatory Visit
Admission: RE | Admit: 2019-08-24 | Discharge: 2019-08-24 | Disposition: A | Discharge status: Home / Self Care | Payer: Medicare Other | Source: Ambulatory Visit | Attending: Family Medicine | Admitting: Family Medicine

## 2019-08-24 DIAGNOSIS — E041 Nontoxic single thyroid nodule: Secondary | ICD-10-CM

## 2019-11-18 DIAGNOSIS — Z1159 Encounter for screening for other viral diseases: Secondary | ICD-10-CM | POA: Diagnosis not present

## 2019-11-18 DIAGNOSIS — D472 Monoclonal gammopathy: Secondary | ICD-10-CM | POA: Diagnosis not present

## 2019-11-18 DIAGNOSIS — I10 Essential (primary) hypertension: Secondary | ICD-10-CM | POA: Diagnosis not present

## 2019-11-18 DIAGNOSIS — H6122 Impacted cerumen, left ear: Secondary | ICD-10-CM | POA: Diagnosis not present

## 2019-11-18 DIAGNOSIS — K760 Fatty (change of) liver, not elsewhere classified: Secondary | ICD-10-CM | POA: Diagnosis not present

## 2019-11-18 DIAGNOSIS — K219 Gastro-esophageal reflux disease without esophagitis: Secondary | ICD-10-CM | POA: Diagnosis not present

## 2019-11-18 DIAGNOSIS — E041 Nontoxic single thyroid nodule: Secondary | ICD-10-CM | POA: Diagnosis not present

## 2019-11-18 DIAGNOSIS — E782 Mixed hyperlipidemia: Secondary | ICD-10-CM | POA: Diagnosis not present

## 2019-11-18 DIAGNOSIS — Z0000 Encounter for general adult medical examination without abnormal findings: Secondary | ICD-10-CM | POA: Diagnosis not present

## 2019-11-18 DIAGNOSIS — Z125 Encounter for screening for malignant neoplasm of prostate: Secondary | ICD-10-CM | POA: Diagnosis not present

## 2019-11-18 DIAGNOSIS — E1169 Type 2 diabetes mellitus with other specified complication: Secondary | ICD-10-CM | POA: Diagnosis not present

## 2019-11-18 DIAGNOSIS — Z86010 Personal history of colonic polyps: Secondary | ICD-10-CM | POA: Diagnosis not present

## 2019-11-23 ENCOUNTER — Telehealth: Payer: Self-pay | Admitting: Oncology

## 2019-11-23 ENCOUNTER — Other Ambulatory Visit: Payer: Self-pay | Admitting: Oncology

## 2019-11-23 DIAGNOSIS — D472 Monoclonal gammopathy: Secondary | ICD-10-CM

## 2019-11-23 NOTE — Telephone Encounter (Signed)
Scheduled apt per 3/23 sch message - mailed letter with appt date and time

## 2019-11-27 ENCOUNTER — Other Ambulatory Visit: Payer: Self-pay

## 2019-11-27 ENCOUNTER — Emergency Department (HOSPITAL_COMMUNITY)
Admission: EM | Admit: 2019-11-27 | Discharge: 2019-11-27 | Disposition: A | Discharge status: Home / Self Care | Payer: Medicare Other | Attending: Emergency Medicine | Admitting: Emergency Medicine

## 2019-11-27 ENCOUNTER — Encounter (HOSPITAL_COMMUNITY): Payer: Self-pay | Admitting: Emergency Medicine

## 2019-11-27 ENCOUNTER — Emergency Department (HOSPITAL_COMMUNITY): Payer: Medicare Other

## 2019-11-27 DIAGNOSIS — R410 Disorientation, unspecified: Secondary | ICD-10-CM | POA: Diagnosis not present

## 2019-11-27 DIAGNOSIS — E119 Type 2 diabetes mellitus without complications: Secondary | ICD-10-CM | POA: Diagnosis not present

## 2019-11-27 DIAGNOSIS — I10 Essential (primary) hypertension: Secondary | ICD-10-CM | POA: Insufficient documentation

## 2019-11-27 DIAGNOSIS — R0789 Other chest pain: Secondary | ICD-10-CM | POA: Diagnosis not present

## 2019-11-27 DIAGNOSIS — J329 Chronic sinusitis, unspecified: Secondary | ICD-10-CM | POA: Diagnosis not present

## 2019-11-27 DIAGNOSIS — Z87891 Personal history of nicotine dependence: Secondary | ICD-10-CM | POA: Diagnosis not present

## 2019-11-27 DIAGNOSIS — Z79899 Other long term (current) drug therapy: Secondary | ICD-10-CM | POA: Insufficient documentation

## 2019-11-27 DIAGNOSIS — R4182 Altered mental status, unspecified: Secondary | ICD-10-CM | POA: Diagnosis not present

## 2019-11-27 DIAGNOSIS — R079 Chest pain, unspecified: Secondary | ICD-10-CM | POA: Diagnosis not present

## 2019-11-27 DIAGNOSIS — Z7982 Long term (current) use of aspirin: Secondary | ICD-10-CM | POA: Insufficient documentation

## 2019-11-27 LAB — CBC
HCT: 41.8 % (ref 39.0–52.0)
Hemoglobin: 13.4 g/dL (ref 13.0–17.0)
MCH: 31.1 pg (ref 26.0–34.0)
MCHC: 32.1 g/dL (ref 30.0–36.0)
MCV: 97 fL (ref 80.0–100.0)
Platelets: 202 10*3/uL (ref 150–400)
RBC: 4.31 MIL/uL (ref 4.22–5.81)
RDW: 12.1 % (ref 11.5–15.5)
WBC: 4.3 10*3/uL (ref 4.0–10.5)
nRBC: 0 % (ref 0.0–0.2)

## 2019-11-27 LAB — BASIC METABOLIC PANEL
Anion gap: 13 (ref 5–15)
BUN: 10 mg/dL (ref 8–23)
CO2: 29 mmol/L (ref 22–32)
Calcium: 8.9 mg/dL (ref 8.9–10.3)
Chloride: 103 mmol/L (ref 98–111)
Creatinine, Ser: 0.65 mg/dL (ref 0.61–1.24)
GFR calc Af Amer: >60 mL/min (ref >60)
GFR calc non Af Amer: >60 mL/min (ref >60)
Glucose, Bld: 109 mg/dL — ABNORMAL HIGH (ref 70–99)
Potassium: 3.3 mmol/L — ABNORMAL LOW (ref 3.5–5.1)
Sodium: 145 mmol/L (ref 135–145)

## 2019-11-27 LAB — TROPONIN I (HIGH SENSITIVITY)
Troponin I (High Sensitivity): 9 ng/L (ref <18)
Troponin I (High Sensitivity): 10 ng/L (ref <18)

## 2019-11-27 MED ORDER — POTASSIUM CHLORIDE CRYS ER 20 MEQ PO TBCR
20.0000 meq | EXTENDED_RELEASE_TABLET | Freq: Once | ORAL | Status: AC
  Administered 2019-11-27: 20 meq via ORAL
  Filled 2019-11-27: qty 1

## 2019-11-27 MED ORDER — SODIUM CHLORIDE 0.9% FLUSH
3.0000 mL | Freq: Once | INTRAVENOUS | Status: AC
  Administered 2019-11-27: 19:00:00 3 mL via INTRAVENOUS

## 2019-11-27 MED ORDER — AMOXICILLIN-POT CLAVULANATE 500-125 MG PO TABS: 1.0000 | ORAL_TABLET | Freq: Three times a day (TID) | ORAL | 0 refills | Status: DC

## 2019-11-27 NOTE — ED Notes (Signed)
Patient verbalizes understanding of discharge instructions. Opportunity for questioning and answers were provided. Armband removed by staff, pt discharged from ED to home 

## 2019-11-27 NOTE — ED Provider Notes (Signed)
[facility] EMERGENCY DEPARTMENT Provider Note   CSN: [id number] Arrival date & time: 11/27/19  1555     History Chief Complaint  Patient presents with  . Chest Pain  . Altered Mental Status    Ryan Rosales is a 69 y.o. male.  HPI  69 year old male presents today complaining of some chest tightness and mild cough.  He describes this as diffuse.  It has been present for several days and has been constant in nature.  He denies any severe dyspnea or productive cough, or fever.  His wife feels that he has had some generalized confusion over the past week.  She reports that he appears to have some word finding difficulty and to be slower than usual.  She also reports some family concern of alcohol use.  Patient states that he has not drank any alcohol today and had a beer last night.  Neither one of them identifies alcohol as a problem although there is a distant history of alcohol abuse.     Past Medical History:  Diagnosis Date  . Diabetes mellitus without complication ([place])   . Hyperlipidemia   . Hypertension     Patient Active Problem List   Diagnosis Date Noted  . HTN (hypertension) 05/19/2013    Past Surgical History:  Procedure Laterality Date  . ROTATOR CUFF REPAIR         Family History  Problem Relation Age of Onset  . Diabetes Mother     Social History   Tobacco Use  . Smoking status: Former Smoker    Types: Cigarettes    Quit date: 10/22/1962    Years since quitting: 79.1  . Smokeless tobacco: Never Used  Substance Use Topics  . Alcohol use: Yes  . Drug use: Not on file    Home Medications Prior to Admission medications   Medication Sig Start Date End Date Taking? Authorizing Provider  amLODipine (NORVASC) 10 MG tablet Take 10 mg by mouth daily.   Yes [provider]  aspirin (BAYER LOW DOSE) 81 MG EC tablet Take 81 mg by mouth daily. Swallow whole.   Yes [provider]  B Complex-C (B-COMPLEX WITH VITAMIN C)  tablet Take 1 tablet by mouth daily.   Yes [provider]  EPINEPHrine (EPIPEN) 0.3 mg/0.3 mL DEVI Inject 0.3 mLs (0.3 mg total) into the muscle once. Patient taking differently: Inject 0.3 mg into the muscle once as needed (for anaphylaxis).  10/23/11  Yes Hayden Rasmussen, MD  losartan (COZAAR) 50 MG tablet Take 50 mg by mouth daily. 04/06/17  Yes [provider]  NON FORMULARY Take 1 tablet by mouth See admin instructions. Focus Factor tablets; Take 1 tablet by mouth once a day   Yes [provider]  Omega-3 Fatty Acids (FISH OIL PO) Take 2 capsules by mouth 2 (two) times daily.    Yes [provider]  omeprazole (PRILOSEC) 20 MG capsule Take 20 mg by mouth daily as needed (for GERD-like symptoms).   Yes [provider]  rosuvastatin (CRESTOR) 5 MG tablet Take 5 mg by mouth at bedtime.   Yes [provider]  cetirizine (ZYRTEC) 10 MG tablet Take 1 tablet (10 mg total) by mouth daily. Patient not taking: Reported on 11/27/2019 10/23/11 11/27/19  Hayden Rasmussen, MD  cyclobenzaprine (FLEXERIL) 10 MG tablet Take 1 tablet (10 mg total) by mouth at bedtime as needed for muscle spasms. Patient not taking: Reported on 11/27/2019 05/19/13   Casimer Lanius  L, MD  famotidine (PEPCID) 40 MG tablet Take 1 tablet (40 mg total) by mouth daily. Patient not taking: Reported on 11/27/2019 10/23/11 11/27/19  Hayden Rasmussen, MD    Allergies    Lisinopril and Hydrochlorothiazide  Review of Systems   Review of Systems  All other systems reviewed and are negative.   Physical Exam Updated Vital Signs BP (!) 144/67   Pulse 73   Temp 98.1 F (36.7 C) (Oral)   Resp 13   SpO2 96%   Physical Exam Vitals and nursing note reviewed.  Constitutional:      Appearance: He is well-developed.  HENT:     Head: Normocephalic.  Eyes:     Pupils: Pupils are equal, round, and reactive to light.  Cardiovascular:     Rate and Rhythm: Normal rate and regular rhythm.    Pulmonary:     Effort: Pulmonary effort is normal.     Breath sounds: Normal breath sounds.  Chest:     Chest wall: No mass.  Abdominal:     General: Bowel sounds are normal.     Palpations: Abdomen is soft.  Musculoskeletal:        General: Normal range of motion.     Cervical back: Normal range of motion and neck supple.     Comments: Abrasion right knee  Skin:    General: Skin is dry.     Capillary Refill: Capillary refill takes less than 2 seconds.  Neurological:     General: No focal deficit present.     Mental Status: He is alert.     Cranial Nerves: No cranial nerve deficit.     Motor: No weakness.     Comments: No palmar drift No leg drift Sensation is intact bilaterally Deep tendon reflexes are equal bilateral both knees  Psychiatric:        Mood and Affect: Mood normal.        Behavior: Behavior normal.     ED Results / Procedures / Treatments   Labs (all labs ordered are listed, but only abnormal results are displayed) Labs Reviewed  BASIC METABOLIC PANEL - Abnormal; Notable for the following components:      Result Value   Potassium 3.3 (*)    Glucose, Bld 109 (*)    All other components within normal limits  CBC  TROPONIN I (HIGH SENSITIVITY)  TROPONIN I (HIGH SENSITIVITY)    EKG EKG Interpretation  Date/Time:  Saturday November 27 2019 16:18:46 EDT Ventricular Rate:  86 PR Interval:  162 QRS Duration: 92 QT Interval:  370 QTC Calculation: 442 R Axis:   46 Text Interpretation: Normal sinus rhythm Normal ECG Confirmed by Pattricia Boss [phone]) on 11/27/2019 5:04:03 PM   Radiology DG Chest 2 View  Result Date: 11/27/2019 CLINICAL DATA:  Chest pain for 2 days. EXAM: CHEST - 2 VIEW COMPARISON:  Chest radiograph 10/21/2009 FINDINGS: The heart size and mediastinal contours are within normal limits. Both lungs are clear. The visualized skeletal structures are unremarkable. IMPRESSION: No active cardiopulmonary disease. Electronically Signed   By: Lovey Newcomer M.D.   On: 11/27/2019 16:38   CT Head Wo Contrast  Result Date: 11/27/2019 CLINICAL DATA:  Confusion and lethargy. EXAM: CT HEAD WITHOUT CONTRAST TECHNIQUE: Contiguous axial images were obtained from the base of the skull through the vertex without intravenous contrast. COMPARISON:  CT head dated 06/25/2017. FINDINGS: Brain: No evidence of acute infarction, hemorrhage, hydrocephalus, extra-axial collection or mass lesion/mass effect. There is mild  cerebral volume loss with associated ex vacuo dilatation. Vascular: There are vascular calcifications in the carotid siphons. Skull: Normal. Negative for fracture or focal lesion. Sinuses/Orbits: There is right sphenoid and right maxillary sinus disease. Other: None. IMPRESSION: 1. No acute intracranial process. 2. Right sphenoid and right maxillary sinus disease. Electronically Signed   By: Zerita Boers M.D.   On: 11/27/2019 17:38    Procedures Procedures (including critical care time)  Medications Ordered in ED Medications  sodium chloride flush (NS) 0.9 % injection 3 mL (3 mLs Intravenous Given 11/27/19 1855)    ED Course  I have reviewed the triage vital signs and the nursing notes.  Pertinent labs & imaging results that were available during my care of the patient were reviewed by me and considered in my medical decision making (see chart for details).    MDM Rules/Calculators/A&P                     Patient presents with some chest tightness it sounds more consistent with some mild bronchitic type symptoms.  Chest x-Ercie Eliasen is clear he is afebrile.  EKG shows no evidence of acute ischemia and chest x-Deitra Craine is clear.  Troponin and repeat troponin are normal Do not feel this is representing acute ischemic event. Patient did appear to have some word hesitancy.  However, symptoms have been going on for 1 week.  CT was normal.  Discussed these results with patient and his wife.  And plan referral to neurology.  Remainder of his work-up was normal  with the exception of some mild hypokalemia with a potassium of 3.3.  He will receive 1 dose of potassium here.  Discussed with the patient's wife that he will potassium rich foods and have this rechecked this week. CT head is significant for sinusitis.  Plan treatment with Augmentin. Final Clinical Impression(s) / ED Diagnoses Final diagnoses:  Sinusitis, unspecified chronicity, unspecified location  Chest pain, unspecified type    Rx / DC Orders ED Discharge Orders    None       Pattricia Boss, MD 11/27/19 [phone]

## 2019-11-27 NOTE — ED Triage Notes (Addendum)
Family member reports 3-4 days of confusion, lethargy, fatigue, generalized weakness, and "brain fog".  Reports intermittent chest pain since last night.  Denies pain at present.  Reports falls over the past few days.  Pt has received COVID vaccine x 2.  Pt alert and oriented at this time.

## 2019-11-27 NOTE — ED Notes (Addendum)
Pt ambulates in room independently, does not require assistance to get in or out of bed. Pt wife states "he actually looks a little better than he does at home. He usually has to [place]."

## 2019-11-27 NOTE — Discharge Instructions (Signed)
Your head CT was consistent with sinusitis.  This may account for some of your symptoms.  However, you are being referred to neurology due to some other confusion symptoms noted. Your potassium is mildly low and you were given potassium supplements here.  Please eat potassium rich foods and have this rechecked with your primary care doctor this week Please return the emergency department if you are having any new or worsening symptoms.

## 2019-11-30 DIAGNOSIS — Z23 Encounter for immunization: Secondary | ICD-10-CM | POA: Diagnosis not present

## 2019-12-03 DIAGNOSIS — H35033 Hypertensive retinopathy, bilateral: Secondary | ICD-10-CM | POA: Diagnosis not present

## 2019-12-03 DIAGNOSIS — E119 Type 2 diabetes mellitus without complications: Secondary | ICD-10-CM | POA: Diagnosis not present

## 2019-12-03 DIAGNOSIS — H40013 Open angle with borderline findings, low risk, bilateral: Secondary | ICD-10-CM | POA: Diagnosis not present

## 2019-12-03 DIAGNOSIS — Z961 Presence of intraocular lens: Secondary | ICD-10-CM | POA: Diagnosis not present

## 2020-01-14 DIAGNOSIS — E041 Nontoxic single thyroid nodule: Secondary | ICD-10-CM | POA: Diagnosis not present

## 2020-01-18 ENCOUNTER — Other Ambulatory Visit: Payer: Self-pay | Admitting: Endocrinology

## 2020-01-18 DIAGNOSIS — E041 Nontoxic single thyroid nodule: Secondary | ICD-10-CM

## 2020-01-26 ENCOUNTER — Inpatient Hospital Stay: Payer: Medicare Other | Attending: Oncology | Admitting: Oncology

## 2020-01-26 ENCOUNTER — Inpatient Hospital Stay: Payer: Medicare Other

## 2020-01-26 VITALS — BP 157/79 | HR 104 | Temp 97.9°F | Resp 18 | Ht 69.0 in | Wt 185.5 lb

## 2020-01-26 DIAGNOSIS — D472 Monoclonal gammopathy: Secondary | ICD-10-CM | POA: Insufficient documentation

## 2020-01-26 DIAGNOSIS — Z79899 Other long term (current) drug therapy: Secondary | ICD-10-CM | POA: Insufficient documentation

## 2020-01-26 LAB — CBC WITH DIFFERENTIAL (CANCER CENTER ONLY)
Abs Immature Granulocytes: 0.01 10*3/uL (ref 0.00–0.07)
Basophils Absolute: 0 10*3/uL (ref 0.0–0.1)
Basophils Relative: 1 %
Eosinophils Absolute: 0.3 10*3/uL (ref 0.0–0.5)
Eosinophils Relative: 5 %
HCT: 38.2 % — ABNORMAL LOW (ref 39.0–52.0)
Hemoglobin: 12.5 g/dL — ABNORMAL LOW (ref 13.0–17.0)
Immature Granulocytes: 0 %
Lymphocytes Relative: 43 %
Lymphs Abs: 2.4 10*3/uL (ref 0.7–4.0)
MCH: 30.9 pg (ref 26.0–34.0)
MCHC: 32.7 g/dL (ref 30.0–36.0)
MCV: 94.3 fL (ref 80.0–100.0)
Monocytes Absolute: 0.5 10*3/uL (ref 0.1–1.0)
Monocytes Relative: 9 %
Neutro Abs: 2.3 10*3/uL (ref 1.7–7.7)
Neutrophils Relative %: 42 %
Platelet Count: 145 10*3/uL — ABNORMAL LOW (ref 150–400)
RBC: 4.05 MIL/uL — ABNORMAL LOW (ref 4.22–5.81)
RDW: 13.1 % (ref 11.5–15.5)
WBC Count: 5.5 10*3/uL (ref 4.0–10.5)
nRBC: 0 % (ref 0.0–0.2)

## 2020-01-26 LAB — CMP (CANCER CENTER ONLY)
ALT: 24 U/L (ref 0–44)
AST: 31 U/L (ref 15–41)
Albumin: 4 g/dL (ref 3.5–5.0)
Alkaline Phosphatase: 76 U/L (ref 38–126)
Anion gap: 13 (ref 5–15)
BUN: 14 mg/dL (ref 8–23)
CO2: 26 mmol/L (ref 22–32)
Calcium: 8.9 mg/dL (ref 8.9–10.3)
Chloride: 101 mmol/L (ref 98–111)
Creatinine: 0.92 mg/dL (ref 0.61–1.24)
GFR, Est AFR Am: >60 mL/min (ref >60)
GFR, Estimated: >60 mL/min (ref >60)
Glucose, Bld: 137 mg/dL — ABNORMAL HIGH (ref 70–99)
Potassium: 3.5 mmol/L (ref 3.5–5.1)
Sodium: 140 mmol/L (ref 135–145)
Total Bilirubin: 0.8 mg/dL (ref 0.3–1.2)
Total Protein: 8.5 g/dL — ABNORMAL HIGH (ref 6.5–8.1)

## 2020-01-26 NOTE — Progress Notes (Signed)
Hematology and Oncology Follow Up Visit  Ryan Rosales [id number] Jan 08, 1951 69 y.o. 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 He was found to have IgG kappa with M spike of 1.2 g/dL and IgG level of 1790.     Current therapy: Active surveillance.  Interim History: Ryan Rosales is here for repeat evaluation. Since the last visit, he reports no major changes in his health.  He denies any recent hospitalization or illnesses.  He denies any excessive fatigue or tiredness.  He denies any bone pain or pathological fractures.  Blood pressure has been elevated and managed by Dr. Rex Rosales.   Medications: Reviewed and updated changes. Current Outpatient Medications  Medication Sig Dispense Refill  . amLODipine (NORVASC) 10 MG tablet Take 10 mg by mouth daily.    Marland Kitchen amoxicillin-clavulanate (AUGMENTIN) 500-125 MG tablet Take 1 tablet (500 mg total) by mouth every 8 (eight) hours. 21 tablet 0  . aspirin (BAYER LOW DOSE) 81 MG EC tablet Take 81 mg by mouth daily. Swallow whole.    . B Complex-C (B-COMPLEX WITH VITAMIN C) tablet Take 1 tablet by mouth daily.    . cetirizine (ZYRTEC) 10 MG tablet Take 1 tablet (10 mg total) by mouth daily. (Patient not taking: Reported on 11/27/2019) 30 tablet 11  . cyclobenzaprine (FLEXERIL) 10 MG tablet Take 1 tablet (10 mg total) by mouth at bedtime as needed for muscle spasms. (Patient not taking: Reported on 11/27/2019) 30 tablet 0  . EPINEPHrine (EPIPEN) 0.3 mg/0.3 mL DEVI Inject 0.3 mLs (0.3 mg total) into the muscle once. (Patient taking differently: Inject 0.3 mg into the muscle once as needed (for anaphylaxis). ) 1 Device 2  . famotidine (PEPCID) 40 MG tablet Take 1 tablet (40 mg total) by mouth daily. (Patient not taking: Reported on 11/27/2019) 30 tablet 0  . losartan (COZAAR) 50 MG tablet Take 50 mg by mouth daily.  3  . NON FORMULARY Take 1 tablet by mouth See admin instructions. Focus Factor  tablets; Take 1 tablet by mouth once a day    . Omega-3 Fatty Acids (FISH OIL PO) Take 2 capsules by mouth 2 (two) times daily.     Marland Kitchen omeprazole (PRILOSEC) 20 MG capsule Take 20 mg by mouth daily as needed (for GERD-like symptoms).    . rosuvastatin (CRESTOR) 5 MG tablet Take 5 mg by mouth at bedtime.     No current facility-administered medications for this visit.     Allergies:  Allergies  Allergen Reactions  . Lisinopril Swelling and Other (See Comments)    Lips became swollen  . Hydrochlorothiazide Nausea Only    .   Physical Exam: Blood pressure (!) 157/79, pulse (!) 104, temperature 97.9 F (36.6 C), temperature source Temporal, resp. rate 18, height 5' 9"  (1.753 m), weight 185 lb 8 oz (84.1 kg), SpO2 98 %.   ECOG: 0   General appearance: Comfortable appearing without any discomfort Head: Normocephalic without any trauma Oropharynx: Mucous membranes are moist and pink without any thrush or ulcers. Eyes: Pupils are equal and round reactive to light. Lymph nodes: No cervical, supraclavicular, inguinal or axillary lymphadenopathy.   Heart:regular rate and rhythm.  S1 and S2 without leg edema. Lung: Clear without any rhonchi or wheezes.  No dullness to percussion. Abdomin: Soft, nontender, nondistended with good bowel sounds.  No hepatosplenomegaly. Musculoskeletal: No joint deformity or effusion.  Full range of motion noted. Neurological: No deficits noted on motor, sensory  and deep tendon reflex exam. Skin: No petechial rash or dryness.  Appeared moist.      Lab Results: Lab Results  Component Value Date   WBC 4.3 11/27/2019   HGB 13.4 11/27/2019   HCT 41.8 11/27/2019   MCV 97.0 11/27/2019   PLT 202 11/27/2019     Chemistry      Component Value Date/Time   NA 145 11/27/2019 1636   NA 143 07/15/2017 1121   K 3.3 (L) 11/27/2019 1636   K 3.7 07/15/2017 1121   CL 103 11/27/2019 1636   CO2 29 11/27/2019 1636   CO2 26 07/15/2017 1121   BUN 10 11/27/2019  1636   BUN 15.5 07/15/2017 1121   CREATININE 0.65 11/27/2019 1636   CREATININE 0.8 07/15/2017 1121      Component Value Date/Time   CALCIUM 8.9 11/27/2019 1636   CALCIUM 9.6 07/15/2017 1121   ALKPHOS 76 01/21/2018 0919   ALKPHOS 74 07/15/2017 1121   AST 52 (H) 01/21/2018 0919   AST 55 (H) 07/15/2017 1121   ALT 51 01/21/2018 0919   ALT 46 07/15/2017 1121   BILITOT 0.9 01/21/2018 0919   BILITOT 0.92 07/15/2017 1121       Results for Ryan Rosales, Ryan Rosales (MRN [id number]) as of 01/26/2020 14:05  Ref. Range 07/15/2017 11:21 01/21/2018 09:18  M Protein SerPl Elph-Mcnc Latest Ref Range: Not Observed g/dL 1.2 (H) 1.1 (H)  IFE 1 Unknown Comment Comment  Globulin, Total Latest Ref Range: 2.2 - 3.9 g/dL 4.2 (H) 3.7  B-Globulin SerPl Elph-Mcnc Latest Ref Range: 0.7 - 1.3 g/dL 1.3 1.1  IgG (Immunoglobin G), Serum Latest Ref Range: 700 - 1,600 mg/dL 1,790 (H) 1,861 (H)  IgM (Immunoglobulin M), Srm Latest Ref Range: 20 - 172 mg/dL  150  IgM, Qn, Serum Latest Ref Range: 20 - 172 mg/dL 150   IgA Latest Ref Range: 61 - 437 mg/dL  434      Impression and Plan:  69 year old man with:  1. 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   He is currently on active surveillance since that time of diagnosis without any evidence to suggest active myeloma. Laboratory data in March 2021 showed a normal hemoglobin, kidney function and electrolytes. Separately electrophoresis in 2019 showed no major changes compared to 2018 data.  The natural course of this disease was reviewed and the risk of progression into multiple myeloma was discussed. At this time his risk remains reasonably low without any signs or symptoms to suggest endorgan damage and symptomatic myeloma. I continue to educate him about the potential progression into myeloma which includes hypercalcemia, bone disease, anemia among others. I recommended continued annual testing for the time being.   2.   Weight loss: Resolved at this time.   3.  Follow-up: In 12 months for repeat evaluation.  30  minutes were dedicated to this visit. The time was spent on reviewing laboratory data, discussing treatment options, and answering questions regarding future plan.    Zola Button, MD 5/26/20212:03 PM

## 2020-01-27 ENCOUNTER — Telehealth: Payer: Self-pay | Admitting: Oncology

## 2020-01-27 LAB — KAPPA/LAMBDA LIGHT CHAINS
Kappa free light chain: 35.1 mg/L — ABNORMAL HIGH (ref 3.3–19.4)
Kappa, lambda light chain ratio: 2.31 — ABNORMAL HIGH (ref 0.26–1.65)
Lambda free light chains: 15.2 mg/L (ref 5.7–26.3)

## 2020-01-27 NOTE — Telephone Encounter (Signed)
Scheduled appt per 5/26 los.  Spoke with pt and they are aware of the appt date and time.

## 2020-02-01 LAB — MULTIPLE MYELOMA PANEL, SERUM
Albumin SerPl Elph-Mcnc: 4.1 g/dL (ref 2.9–4.4)
Albumin/Glob SerPl: 1.1 (ref 0.7–1.7)
Alpha 1: 0.2 g/dL (ref 0.0–0.4)
Alpha2 Glob SerPl Elph-Mcnc: 0.7 g/dL (ref 0.4–1.0)
B-Globulin SerPl Elph-Mcnc: 1.2 g/dL (ref 0.7–1.3)
Gamma Glob SerPl Elph-Mcnc: 1.9 g/dL — ABNORMAL HIGH (ref 0.4–1.8)
Globulin, Total: 4 g/dL — ABNORMAL HIGH (ref 2.2–3.9)
IgA: 420 mg/dL (ref 61–437)
IgG (Immunoglobin G), Serum: 1906 mg/dL — ABNORMAL HIGH (ref 603–1613)
IgM (Immunoglobulin M), Srm: 150 mg/dL (ref 20–172)
M Protein SerPl Elph-Mcnc: 1.2 g/dL — ABNORMAL HIGH
Total Protein ELP: 8.1 g/dL (ref 6.0–8.5)

## 2020-02-02 ENCOUNTER — Ambulatory Visit
Admission: RE | Admit: 2020-02-02 | Discharge: 2020-02-02 | Disposition: A | Discharge status: Home / Self Care | Payer: Medicare Other | Source: Ambulatory Visit | Attending: Endocrinology | Admitting: Endocrinology

## 2020-02-02 DIAGNOSIS — E041 Nontoxic single thyroid nodule: Secondary | ICD-10-CM | POA: Diagnosis not present

## 2020-02-28 ENCOUNTER — Encounter: Payer: Self-pay | Admitting: Neurology

## 2020-02-28 ENCOUNTER — Other Ambulatory Visit: Payer: Self-pay

## 2020-02-28 ENCOUNTER — Ambulatory Visit (INDEPENDENT_AMBULATORY_CARE_PROVIDER_SITE_OTHER): Payer: Medicare Other | Admitting: Neurology

## 2020-02-28 VITALS — BP 160/84 | HR 85 | Ht 68.5 in | Wt 185.0 lb

## 2020-02-28 DIAGNOSIS — F05 Delirium due to known physiological condition: Secondary | ICD-10-CM

## 2020-02-28 DIAGNOSIS — G3184 Mild cognitive impairment, so stated: Secondary | ICD-10-CM | POA: Diagnosis not present

## 2020-02-28 DIAGNOSIS — I10 Essential (primary) hypertension: Secondary | ICD-10-CM

## 2020-02-28 MED ORDER — ALPRAZOLAM 1 MG PO TABS: ORAL_TABLET | ORAL | 0 refills | Status: AC

## 2020-02-28 NOTE — Addendum Note (Signed)
Addended by: Larey Seat on: 02/28/2020 03:04 PM   Modules accepted: Orders

## 2020-02-28 NOTE — Patient Instructions (Signed)
Dementia Dementia is a condition that affects the way the brain functions. It often affects memory and thinking. Usually, dementia gets worse with time and cannot be reversed (progressive dementia). There are many types of dementia, including:  Alzheimer's disease. This type is the most common.  Vascular dementia. This type may happen as the result of a stroke.  Lewy body dementia. This type may happen to people who have Parkinson's disease.  Frontotemporal dementia. This type is caused by damage to nerve cells (neurons) in certain parts of the brain. Some people may be affected by more than one type of dementia. This is called mixed dementia. What are the causes? Dementia is caused by damage to cells in the brain. The area of the brain and the types of cells damaged determine the type of dementia. Usually, this damage is irreversible or cannot be undone. Some examples of irreversible causes include:  Conditions that affect the blood vessels of the brain, such as diabetes, heart disease, or blood vessel disease.  Genetic mutations. In some cases, changes in the brain may be caused by another condition and can be reversed or slowed. Some examples of reversible causes include:  Injury to the brain.  Certain medicines.  Infection, such as meningitis.  Metabolic problems, such as vitamin B12 deficiency or thyroid disease.  Pressure on the brain, such as from a tumor or blood clot. What are the signs or symptoms? Symptoms of dementia depend on the type of dementia. Common signs of dementia include problems with remembering, thinking, problem solving, decision making, and communicating. These signs develop slowly or get worse with time. This may include:  Problems remembering things.  Having trouble taking a bath or putting clothes on.  Forgetting appointments.  Forgetting to pay bills.  Difficulty planning and preparing meals.  Having trouble speaking.  Getting lost easily. How  is this diagnosed? This condition is diagnosed by a specialist (neurologist). It is diagnosed based on the history of your symptoms, your medical history, a physical exam, and tests. Tests may include:  Tests to evaluate brain function, such as memory tests, cognitive tests, and other tests.  Lab tests, such as blood or urine tests.  Imaging tests, such as a CT scan, a PET scan, or an MRI.  Genetic testing. This may be done if other family members have a diagnosis of certain types of dementia. Your health care provider will talk with you and your family, friends, or caregivers about your history and symptoms. How is this treated?  Treatment for this condition depends on the cause of the dementia. Progressive dementias, such as Alzheimer's disease, cannot be cured, but there may be treatments that help to manage symptoms. Treatment might involve taking medicines that may help to:  Control the dementia.  Slow down the progression of the dementia.  Manage symptoms. In some cases, treating the cause of your dementia can improve symptoms, reverse symptoms, or slow down how quickly your dementia becomes worse. Your health care provider can direct you to support groups, organizations, and other health care providers who can help with decisions about your care. Follow these instructions at home: Medicines  Take over-the-counter and prescription medicines only as told by your health care provider.  Use a pill organizer or pill reminder to help you manage your medicines.  Avoid taking medicines that can affect thinking, such as pain medicines or sleeping medicines. Lifestyle  Make healthy lifestyle choices. ? Be physically active as told by your health care provider. ? Do   not use any products that contain nicotine or tobacco, such as cigarettes, e-cigarettes, and chewing tobacco. If you need help quitting, ask your health care provider. ? Do not drink alcohol. ? Practice stress-management  techniques when you get stressed. ? Spend time with other people.  Make sure to get quality sleep. These tips can help you get a good night's rest: ? Avoid napping during the day. ? Keep your sleeping area dark and cool. ? Avoid exercising during the few hours before you go to bed. ? Avoid caffeine products in the evening. Eating and drinking  Drink enough fluid to keep your urine pale yellow.  Eat a healthy diet. General instructions   Work with your health care provider to determine what you need help with and what your safety needs are.  Talk with your health care provider about whether it is safe for you to drive.  If you were given a bracelet that identifies you as a person with memory loss or tracks your location, make sure to wear it at all times.  Work with your family to make important decisions, such as advance directives, medical power of attorney, or a living will.  Keep all follow-up visits as told by your health care provider. This is important. Where to find more information  Alzheimer's Association: www.alz.org  National Institute on Aging: www.nia.nih.gov/alzheimers  World Health Organization: www.who.int Contact a health care provider if:  You have any new or worsening symptoms.  You have problems with choking or swallowing. Get help right away if:  You feel depressed or sad, or feel that you want to harm yourself.  Your family members become concerned for your safety. If you ever feel like you may hurt yourself or others, or have thoughts about taking your own life, get help right away. You can go to your nearest emergency department or call:  Your local emergency services (911 in the [place]  A suicide crisis helpline, such as the National Suicide Prevention Lifeline at 1-800-273-8255. This is open 24 hours a day. Summary  Dementia is a condition that affects the way the brain functions. Dementia often affects memory and thinking.  Usually,  dementia gets worse with time and cannot be reversed (progressive dementia).  Treatment for this condition depends on the cause of the dementia.  Work with your health care provider to determine what you need help with and what your safety needs are.  Your health care provider can direct you to support groups, organizations, and other health care providers who can help with decisions about your care. This information is not intended to replace advice given to you by your health care provider. Make sure you discuss any questions you have with your health care provider. Document Revised: 11/03/2018 Document Reviewed: 11/03/2018 Elsevier Patient Education  2020 Elsevier Inc.  

## 2020-02-28 NOTE — Progress Notes (Signed)
Provider:  Larey Seat, M D  Referring Provider: Hulan Fess, MD Primary Care Physician:  Hulan Fess, MD  Chief Complaint  Patient presents with  . New Patient (Initial Visit)    pt with wife, rm 44. pt has been having some concerns with memory loss. he had some pressure on his brain and he went to ER in march he had High blood pressure that they feel was the culprit of that pressure. CT of head completed at the ER visit. no other images completed.     HPI:  Ryan Rosales is a 69 y.o. male  Is seen here as a referral not from Dr. Rex Kras, but  via ED - upon request of ED.    The patient was seen in November 21 2019 with very high Bp and cognitive confusion.  He is on amlodipine ( norvasc 5 mg) , losartin. 11/27/19  1555   History    Chief Complaint  Patient presents with  . Chest Pain  . Altered Mental Status    Ryan Rosales is a 69 y.o. male. HPI  69 year old male presents today complaining of some chest tightness and mild cough.  He describes this as diffuse.  It has been present for several days and has been constant in nature.  He denies any severe dyspnea or productive cough, or fever.  His wife feels that he has had some generalized confusion over the past week.  She reports that he appears to have some word finding difficulty and to be slower than usual.  She also reports some family concern of alcohol use.  Patient states that he has not drank any alcohol today and had a beer last night.  Neither one of them identifies alcohol as a problem although there is a distant history of alcohol abuse.  The patient seesm to be confused, logorrhoeic and giving at best tangential answers. He is unaware of his deficits, his delirium like word flow.  When he presented to the emergency room on 27 March she had an abrasion of the right knee, but no other abnormalities were noted no cranial nerve deficit no weakness in the emergency room described his mood and behavior is normal,  there was no palmar drift normal sensation.  Normal sinus rhythm EKG, chest high blood pressures with low potassium 3.3 a CT of the head without contrast was ordered no evidence of infarction hemorrhage, hydrocephalus, any trauma mild cerebral volume loss, some vascular calcifications in the carotid artery siphons, there is right maxillary sinus disease,  His wife is concerned about the family history , in her husband's family there have been many man affected by memory disorders.  Father with a brain tumor GBM-, Brother with colon cancer.  Sister died in [place] - hot by a sanitation truck.   Social history - he used to drink possibly heavy - but he has 2-3 drinks a week , beer only.  No tobacco, no illegal drugs.   Review of Systems: Out of a complete 14 system review, the patient complains of only the following symptoms, and all other reviewed systems are negative.   Very pleasantly confused male patient with memory deficits progressively worsening since January -March.   Social History   Socioeconomic History  . Marital status: Married    Spouse name: Not on file  . Number of children: Not on file  . Years of education: Not on file  . Highest education level: Not on file  Occupational History  .  Not on file  Tobacco Use  . Smoking status: Former Smoker    Types: Cigarettes    Quit date: 10/22/1962    Years since quitting: 57.3  . Smokeless tobacco: Never Used  Substance and Sexual Activity  . Alcohol use: Yes  . Drug use: Not on file  . Sexual activity: Not on file  Other Topics Concern  . Not on file  Social History Narrative  . Not on file   Social Determinants of Health   Financial Resource Strain:   . Difficulty of Paying Living Expenses:   Food Insecurity:   . Worried About Charity fundraiser in the Last Year:   . Arboriculturist in the Last Year:   Transportation Needs:   . Film/video editor (Medical):   Marland Kitchen Lack of Transportation (Non-Medical):   Physical  Activity:   . Days of Exercise per Week:   . Minutes of Exercise per Session:   Stress:   . Feeling of Stress :   Social Connections:   . Frequency of Communication with Friends and Family:   . Frequency of Social Gatherings with Friends and Family:   . Attends Religious Services:   . Active Member of Clubs or Organizations:   . Attends Archivist Meetings:   Marland Kitchen Marital Status:   Intimate Partner Violence:   . Fear of Current or Ex-Partner:   . Emotionally Abused:   Marland Kitchen Physically Abused:   . Sexually Abused:     Family History  Problem Relation Age of Onset  . Diabetes Mother     Past Medical History:  Diagnosis Date  . Diabetes mellitus without complication ([place])   . Hyperlipidemia   . Hypertension     Past Surgical History:  Procedure Laterality Date  . ROTATOR CUFF REPAIR      Current Outpatient Medications  Medication Sig Dispense Refill  . amLODipine (NORVASC) 10 MG tablet Take 10 mg by mouth daily.    Marland Kitchen aspirin (BAYER LOW DOSE) 81 MG EC tablet Take 81 mg by mouth daily. Swallow whole.    . B Complex-C (B-COMPLEX WITH VITAMIN C) tablet Take 1 tablet by mouth daily.    . carvedilol (COREG) 3.125 MG tablet Take 3.125 mg by mouth 2 (two) times daily.    Marland Kitchen EPINEPHrine (EPIPEN) 0.3 mg/0.3 mL DEVI Inject 0.3 mLs (0.3 mg total) into the muscle once. (Patient taking differently: Inject 0.3 mg into the muscle once as needed (for anaphylaxis). ) 1 Device 2  . losartan (COZAAR) 50 MG tablet Take 50 mg by mouth daily.  3  . NON FORMULARY Take 1 tablet by mouth See admin instructions. Focus Factor tablets; Take 1 tablet by mouth once a day    . Omega-3 Fatty Acids (FISH OIL PO) Take 2 capsules by mouth 2 (two) times daily.     Marland Kitchen omeprazole (PRILOSEC) 20 MG capsule Take 20 mg by mouth daily as needed (for GERD-like symptoms).     No current facility-administered medications for this visit.    Allergies as of 02/28/2020 - Review Complete 02/28/2020  Allergen Reaction  Noted  . Lisinopril Swelling and Other (See Comments) 05/19/2013  . Crestor [rosuvastatin calcium]  02/28/2020  . Hydrochlorothiazide Nausea Only 11/27/2019    Vitals: BP (!) 160/84   Pulse 85   Ht 5' 8.5" (1.74 m)   Wt 185 lb (83.9 kg)   BMI 27.72 kg/m  Last Weight:  Wt Readings from Last 1 Encounters:  02/28/20  185 lb (83.9 kg)   Last Height:   Ht Readings from Last 1 Encounters:  02/28/20 5' 8.5" (1.74 m)    Physical exam:  General: The patient is awake, alert and appears not in acute distress. The patient is well groomed. Head: Normocephalic, atraumatic. Neck is supple. Mallampati. Cardiovascular:  Regular rate and rhythm, without  murmurs or carotid bruit, and without distended neck veins. Respiratory: Lungs are clear to auscultation. Skin:  Without evidence of edema, or rash Trunk: BMI is 27 kg/m2 and patient  has normal posture.  Neurologic exam : The patient is awake and alert, oriented to place and time.  Memory subjective described as intact. There is a normal attention span & concentration ability. Speech is fluent  But non sensical-without dysarthria, dysphonia .   Mood and affect are aloof.   No flowsheet data found. mini-mental status exam No validity-   Mini-Mental status examination finally revealed 24 out of 30 points the patient had trouble with his serial 7 calculation but he registered but could not recall 3 words a Mini-Mental wrist exam.  He was oriented to date year month day as well as season in clinic he did not know my name but he met me today for the first time so I find that excuse ago.  He could name only 4 animals he could copy on average.  He would write a sentence on the Mo[employer] test he could neither perform the Trail Making Test he could copy a cube three-dimensional image he had difficulties with putting the hands into the clock face and actually doing a little hand pointing to the #12.  Cranial nerves: Pupils are equal and briskly reactive to  light. Funduscopic exam without evidence of pallor or edema. Extraocular movements  in vertical and horizontal planes intact and without nystagmus.  Visual fields by finger perimetry are intact. Hearing to finger rub intact.  Facial sensation intact to fine touch. Facial motor strength is symmetric and tongue and uvula move midline. Tongue protrusion into either cheek is normal. Shoulder shrug is normal.   Motor exam:  Normal tone ,muscle bulk and symmetric  strength in all extremities.  Sensory:  Fine touch, pinprick and vibration were tested in all extremities.  Proprioception was normal.  Coordination: Rapid alternating movements in the fingers/hands were normal.  Finger-to-nose maneuver  normal without evidence of ataxia, dysmetria or tremor.  Gait and station: Patient walks without assistive device . Tandem gait is unfragmented. Romberg testing is negative   Deep tendon reflexes: in the  upper and lower extremities are attenuated- Babinski maneuver response is equivocal.   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   No associated cerebellar or wernicke signs, normal eye movements.   Risk factor for stroke is HTN  History for alcohol abuse- intoxication 3 or 4 years ago.   Plan:  Treatment plan and additional workup :  MRI brain with and without contrast - rule out stroke.   hydration ! I urged to keep it up.  RV with me in 4-6 weeks.   Asencion Partridge Demonie Kassa MD 02/28/2020

## 2020-02-29 ENCOUNTER — Telehealth: Payer: Self-pay | Admitting: Neurology

## 2020-02-29 NOTE — Telephone Encounter (Signed)
Medicare/bankers life order sent to [facility]. No auth they will reach out to the patient to schedule.

## 2020-03-12 ENCOUNTER — Ambulatory Visit
Admission: RE | Admit: 2020-03-12 | Discharge: 2020-03-12 | Disposition: A | Discharge status: Home / Self Care | Payer: Medicare Other | Source: Ambulatory Visit | Attending: Neurology | Admitting: Neurology

## 2020-03-12 ENCOUNTER — Other Ambulatory Visit: Payer: Self-pay

## 2020-03-12 DIAGNOSIS — G3184 Mild cognitive impairment, so stated: Secondary | ICD-10-CM | POA: Diagnosis not present

## 2020-03-12 DIAGNOSIS — I10 Essential (primary) hypertension: Secondary | ICD-10-CM

## 2020-03-12 DIAGNOSIS — F05 Delirium due to known physiological condition: Secondary | ICD-10-CM | POA: Diagnosis not present

## 2020-03-12 MED ORDER — GADOBENATE DIMEGLUMINE 529 MG/ML IV SOLN
17.0000 mL | Freq: Once | INTRAVENOUS | Status: AC | PRN
  Administered 2020-03-12: 17 mL via INTRAVENOUS

## 2020-03-16 ENCOUNTER — Telehealth: Payer: Self-pay | Admitting: Neurology

## 2020-03-16 NOTE — Telephone Encounter (Signed)
-----   Message from Penni Bombard, MD sent at 03/15/2020  4:48 PM EDT ----- Mild atrophy and and moderate chronic small vessel ischemic disease. No acute findings. -VRP

## 2020-03-16 NOTE — Telephone Encounter (Signed)
Called the patient and reviewed the image results with him. Reviewed that there are small vessel changes that are present and common as we age. We also see these changes in patients with hx of memory concerns, high blood pressure, migraines. Advised the patient that there was nothing that indicated stroke, tumor or bleeding on the brain. Patient was appreciative for the call and verbalized understanding. He repeated back to me so confirm he understood correctly. Pt had no questions at this time but was encouraged to call back if questions arise.

## 2020-05-07 ENCOUNTER — Encounter (HOSPITAL_COMMUNITY): Payer: Self-pay | Admitting: Emergency Medicine

## 2020-05-07 ENCOUNTER — Emergency Department (HOSPITAL_COMMUNITY)
Admission: EM | Admit: 2020-05-07 | Discharge: 2020-05-07 | Disposition: A | Discharge status: Home / Self Care | Payer: Medicare Other | Attending: Emergency Medicine | Admitting: Emergency Medicine

## 2020-05-07 ENCOUNTER — Other Ambulatory Visit: Payer: Self-pay | Admitting: Neurology

## 2020-05-07 ENCOUNTER — Emergency Department (HOSPITAL_COMMUNITY): Payer: Medicare Other

## 2020-05-07 ENCOUNTER — Other Ambulatory Visit: Payer: Self-pay

## 2020-05-07 DIAGNOSIS — R262 Difficulty in walking, not elsewhere classified: Secondary | ICD-10-CM | POA: Diagnosis not present

## 2020-05-07 DIAGNOSIS — I10 Essential (primary) hypertension: Secondary | ICD-10-CM | POA: Diagnosis not present

## 2020-05-07 DIAGNOSIS — F10129 Alcohol abuse with intoxication, unspecified: Secondary | ICD-10-CM | POA: Insufficient documentation

## 2020-05-07 DIAGNOSIS — R4781 Slurred speech: Secondary | ICD-10-CM | POA: Insufficient documentation

## 2020-05-07 DIAGNOSIS — R410 Disorientation, unspecified: Secondary | ICD-10-CM | POA: Diagnosis not present

## 2020-05-07 DIAGNOSIS — G319 Degenerative disease of nervous system, unspecified: Secondary | ICD-10-CM | POA: Diagnosis not present

## 2020-05-07 DIAGNOSIS — I6782 Cerebral ischemia: Secondary | ICD-10-CM | POA: Diagnosis not present

## 2020-05-07 DIAGNOSIS — Z5321 Procedure and treatment not carried out due to patient leaving prior to being seen by health care provider: Secondary | ICD-10-CM | POA: Insufficient documentation

## 2020-05-07 DIAGNOSIS — R2681 Unsteadiness on feet: Secondary | ICD-10-CM | POA: Diagnosis not present

## 2020-05-07 DIAGNOSIS — R2689 Other abnormalities of gait and mobility: Secondary | ICD-10-CM | POA: Diagnosis not present

## 2020-05-07 LAB — COMPREHENSIVE METABOLIC PANEL
ALT: 36 U/L (ref 0–44)
AST: 45 U/L — ABNORMAL HIGH (ref 15–41)
Albumin: 4.3 g/dL (ref 3.5–5.0)
Alkaline Phosphatase: 71 U/L (ref 38–126)
Anion gap: 13 (ref 5–15)
BUN: 10 mg/dL (ref 8–23)
CO2: 28 mmol/L (ref 22–32)
Calcium: 8.9 mg/dL (ref 8.9–10.3)
Chloride: 103 mmol/L (ref 98–111)
Creatinine, Ser: 0.8 mg/dL (ref 0.61–1.24)
GFR calc Af Amer: >60 mL/min (ref >60)
GFR calc non Af Amer: >60 mL/min (ref >60)
Glucose, Bld: 116 mg/dL — ABNORMAL HIGH (ref 70–99)
Potassium: 3 mmol/L — ABNORMAL LOW (ref 3.5–5.1)
Sodium: 144 mmol/L (ref 135–145)
Total Bilirubin: 0.8 mg/dL (ref 0.3–1.2)
Total Protein: 8.3 g/dL — ABNORMAL HIGH (ref 6.5–8.1)

## 2020-05-07 LAB — I-STAT CHEM 8, ED
BUN: 11 mg/dL (ref 8–23)
Calcium, Ion: 1.04 mmol/L — ABNORMAL LOW (ref 1.15–1.40)
Chloride: 101 mmol/L (ref 98–111)
Creatinine, Ser: 1.3 mg/dL — ABNORMAL HIGH (ref 0.61–1.24)
Glucose, Bld: 110 mg/dL — ABNORMAL HIGH (ref 70–99)
HCT: 40 % (ref 39.0–52.0)
Hemoglobin: 13.6 g/dL (ref 13.0–17.0)
Potassium: 3.1 mmol/L — ABNORMAL LOW (ref 3.5–5.1)
Sodium: 146 mmol/L — ABNORMAL HIGH (ref 135–145)
TCO2: 27 mmol/L (ref 22–32)

## 2020-05-07 LAB — CBC
HCT: 35.4 % — ABNORMAL LOW (ref 39.0–52.0)
Hemoglobin: 11.4 g/dL — ABNORMAL LOW (ref 13.0–17.0)
MCH: 30.1 pg (ref 26.0–34.0)
MCHC: 32.2 g/dL (ref 30.0–36.0)
MCV: 93.4 fL (ref 80.0–100.0)
Platelets: 167 10*3/uL (ref 150–400)
RBC: 3.79 MIL/uL — ABNORMAL LOW (ref 4.22–5.81)
RDW: 14 % (ref 11.5–15.5)
WBC: 5 10*3/uL (ref 4.0–10.5)
nRBC: 0 % (ref 0.0–0.2)

## 2020-05-07 LAB — DIFFERENTIAL
Abs Immature Granulocytes: 0.01 10*3/uL (ref 0.00–0.07)
Basophils Absolute: 0 10*3/uL (ref 0.0–0.1)
Basophils Relative: 1 %
Eosinophils Absolute: 0.1 10*3/uL (ref 0.0–0.5)
Eosinophils Relative: 2 %
Immature Granulocytes: 0 %
Lymphocytes Relative: 56 %
Lymphs Abs: 2.8 10*3/uL (ref 0.7–4.0)
Monocytes Absolute: 0.4 10*3/uL (ref 0.1–1.0)
Monocytes Relative: 8 %
Neutro Abs: 1.7 10*3/uL (ref 1.7–7.7)
Neutrophils Relative %: 33 %

## 2020-05-07 LAB — APTT: aPTT: 28 seconds (ref 24–36)

## 2020-05-07 LAB — PROTIME-INR
INR: 1 (ref 0.8–1.2)
Prothrombin Time: 13 seconds (ref 11.4–15.2)

## 2020-05-07 MED ORDER — SODIUM CHLORIDE 0.9% FLUSH: 3.0000 mL | Freq: Once | INTRAVENOUS | Status: DC

## 2020-05-07 NOTE — ED Triage Notes (Signed)
Pt to triage via [facility] from home.  Family reports slurred speech and unsteady gait x 6 months.  States symptoms are worse since yesterday.  + ETOH.  CBG 121.  No arm drift.  18 g LAC.

## 2020-05-07 NOTE — Progress Notes (Signed)
I returned call from patient's wife to [facility] answering service and spoke to her.  She informed me that patient developed speech difficulties and difficulty walking and she called EMS who took him to [facility] where he is currently being evaluated.  He seems to be improving.  She had questions about his diagnosis and care and I deferred these to ER physician who is evaluating him.  She voiced understanding.  CT scan of the head had been done so far which did show no acute abnormality only age-appropriate changes of atrophy and small vessel disease.

## 2020-05-30 ENCOUNTER — Encounter: Payer: Self-pay | Admitting: Neurology

## 2020-05-30 ENCOUNTER — Ambulatory Visit (INDEPENDENT_AMBULATORY_CARE_PROVIDER_SITE_OTHER): Payer: Medicare Other | Admitting: Neurology

## 2020-05-30 VITALS — BP 158/83 | HR 83 | Ht 68.5 in | Wt 182.0 lb

## 2020-05-30 DIAGNOSIS — T6701XD Heatstroke and sunstroke, subsequent encounter: Secondary | ICD-10-CM

## 2020-05-30 DIAGNOSIS — G3184 Mild cognitive impairment, so stated: Secondary | ICD-10-CM

## 2020-05-30 DIAGNOSIS — I110 Hypertensive heart disease with heart failure: Secondary | ICD-10-CM | POA: Diagnosis not present

## 2020-05-30 DIAGNOSIS — T6701XA Heatstroke and sunstroke, initial encounter: Secondary | ICD-10-CM | POA: Insufficient documentation

## 2020-05-30 NOTE — Progress Notes (Signed)
Provider:  Larey Seat, M D  Referring Provider: Hulan Fess, MD Primary Care Physician:  Hulan Fess, MD  Chief Complaint  Patient presents with  . Follow-up    pt alone, rm 11. presents today for follow up post MRI. states overall has been doing well. there has been some concerns that memory concerns may be worsening.    HPI:  Ryan Rosales is a 69 y.o. male , who is seen herein a RV on 05-30-2020. Mr. Baggerly returns today for a reevaluation of his current cognitive status but also to review his MRI of the brain which was just performed on 11 July.  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  This does not pose a high risk of bleeding.  Dr. Felecia Shelling interpreted the imaging study the patient also underwent an MMSE Mini-Mental status exam here today and he scored the record 29 out of 69.  He had normal visual-spatial orientation he named only 6 animals which is a little less than the average that was the only point he lost.  He denies problems with balance, driving is now allowed again.  He underwent 18 month ago cataract surgery, has good vision.        Originally seen as an ED referral - upon request of ED. The patient was seen in November 21 2019 with very high Bp and cognitive confusion.  He is on amlodipine ( norvasc 5 mg) , losartin. 11/27/19  1555   History    Chief Complaint  Patient presents with  . Chest Pain  . Altered Mental Status    Ryan Rosales is a 69 y.o. male. HPI  69 year old male presents today complaining of some chest tightness and mild cough.  He describes this as diffuse.  It has been present for several days and has been constant in nature.  He denies any severe dyspnea or productive cough, or fever.  His wife feels that he has had  some generalized confusion over the past week.  She reports that he appears to have some word finding difficulty and to be slower than usual.  She also reports some family concern of alcohol use.  Patient states that he has not drank any alcohol today and had a beer last night.  Neither one of them identifies alcohol as a problem although there is a distant history of alcohol abuse.  The patient seesm to be confused, logorrhoeic and giving at best tangential answers. He is unaware of his deficits, his delirium like word flow.  When he presented to the emergency room on 27 March she had an abrasion of the right knee, but no other abnormalities were noted no cranial nerve deficit no weakness in the emergency room described his mood and behavior is normal, there was no palmar drift normal sensation.  Normal sinus rhythm EKG, chest high blood pressures with low potassium 3.3 a CT of the head without contrast was ordered no evidence of infarction hemorrhage, hydrocephalus, any trauma mild cerebral volume loss, some vascular calcifications in the carotid artery siphons, there is right maxillary sinus disease,  His wife is concerned about the family history , in her husband's family there have been many man affected by memory disorders.  Father with a brain tumor GBM-, Brother with colon cancer.  Sister  died in [place] - hot by a sanitation truck.   Social history - he used to drink possibly heavy - but he has 2-3 drinks a week , beer only.  No tobacco, no illegal drugs.   Review of Systems: Out of a complete 14 system review, the patient complains of only the following symptoms, and all other reviewed systems are negative.   Very pleasantly confused male patient with memory deficits progressively worsening since January -March.   Social History   Socioeconomic History  . Marital status: Married    Spouse name: Not on file  . Number of children: Not on file  . Years of education: Not on file  . Highest  education level: Not on file  Occupational History  . Not on file  Tobacco Use  . Smoking status: Former Smoker    Types: Cigarettes    Quit date: 10/22/1962    Years since quitting: 57.6  . Smokeless tobacco: Never Used  Substance and Sexual Activity  . Alcohol use: Yes  . Drug use: Not Currently  . Sexual activity: Not on file  Other Topics Concern  . Not on file  Social History Narrative  . Not on file   Social Determinants of Health   Financial Resource Strain:   . Difficulty of Paying Living Expenses: Not on file  Food Insecurity:   . Worried About Running Out of Food in the Last Year: Not on file  . Ran Out of Food in the Last Year: Not on file  Transportation Needs:   . Lack of Transportation (Medical): Not on file  . Lack of Transportation (Non-Medical): Not on file  Physical Activity:   . Days of Exercise per Week: Not on file  . Minutes of Exercise per Session: Not on file  Stress:   . Feeling of Stress : Not on file  Social Connections:   . Frequency of Communication with Friends and Family: Not on file  . Frequency of Social Gatherings with Friends and Family: Not on file  . Attends Religious Services: Not on file  . Active Member of Clubs or Organizations: Not on file  . Attends Club or Organization Meetings: Not on file  . Marital Status: Not on file  Intimate Partner Violence:   . Fear of Current or Ex-Partner: Not on file  . Emotionally Abused: Not on file  . Physically Abused: Not on file  . Sexually Abused: Not on file    Family History  Problem Relation Age of Onset  . Diabetes Mother     Past Medical History:  Diagnosis Date  . Diabetes mellitus without complication (HCC)   . Hyperlipidemia   . Hypertension     Past Surgical History:  Procedure Laterality Date  . ROTATOR CUFF REPAIR      Current Outpatient Medications  Medication Sig Dispense Refill  . ALPRAZolam (XANAX) 1 MG tablet As needed for MRI 2 tablet 0  . amLODipine  (NORVASC) 10 MG tablet Take 10 mg by mouth daily.    . aspirin (BAYER LOW DOSE) 81 MG EC tablet Take 81 mg by mouth daily. Swallow whole.    . B Complex-C (B-COMPLEX WITH VITAMIN C) tablet Take 1 tablet by mouth daily.    . carvedilol (COREG) 3.125 MG tablet Take 3.125 mg by mouth 2 (two) times daily.    . EPINEPHrine (EPIPEN) 0.3 mg/0.3 mL DEVI Inject 0.3 mLs (0.3 mg total) into the muscle once. (Patient taking differently: Inject 0.3 mg into the   muscle once as needed (for anaphylaxis). ) 1 Device 2  . losartan (COZAAR) 50 MG tablet Take 50 mg by mouth daily.  3  . NON FORMULARY Take 1 tablet by mouth See admin instructions. Focus Factor tablets; Take 1 tablet by mouth once a day    . Omega-3 Fatty Acids (FISH OIL PO) Take 2 capsules by mouth 2 (two) times daily.     Marland Kitchen omeprazole (PRILOSEC) 20 MG capsule Take 20 mg by mouth daily as needed (for GERD-like symptoms).     No current facility-administered medications for this visit.    Allergies as of 05/30/2020 - Review Complete 05/30/2020  Allergen Reaction Noted  . Lisinopril Swelling and Other (See Comments) 05/19/2013  . Crestor [rosuvastatin calcium]  02/28/2020  . Hydrochlorothiazide Nausea Only 11/27/2019    Vitals: BP (!) 158/83   Pulse 83   Ht 5' 8.5" (1.74 m)   Wt 182 lb (82.6 kg)   BMI 27.27 kg/m  Last Weight:  Wt Readings from Last 1 Encounters:  05/30/20 182 lb (82.6 kg)   Last Height:   Ht Readings from Last 1 Encounters:  05/30/20 5' 8.5" (1.74 m)    Physical exam:  General: The patient is awake, alert and appears not in acute distress. The patient is well groomed. Head: Normocephalic, atraumatic. Neck is supple. Mallampati. Cardiovascular:  Regular rate and rhythm, without  murmurs or carotid bruit, and without distended neck veins. Respiratory: Lungs are clear to auscultation. Skin:  Without evidence of edema, or rash Trunk: BMI is 27 kg/m2 and patient  has normal posture.  Neurologic exam : The patient is  awake and alert, oriented to place and time.  Memory subjective described as intact. There is a normal attention span & concentration ability. Speech is fluent  But non sensical-without dysarthria, dysphonia .   Mood and affect are aloof.   Mini-Mental status examination finally revealed 24 out of 30 points the patient had trouble with his serial 7 calculation but he registered but could not recall 3 words a Mini-Mental wrist exam.  He was oriented to date year month day as well as season in clinic he did not know my name but he met me today for the first time so I find that excuse ago.  He could name only 4 animals he could copy on average.  He would write a sentence on the MoCA test he could neither perform the Trail Making Test he could copy a cube three-dimensional image he had difficulties with putting the hands into the clock face and actually doing a little hand pointing to the #12.   MMSE - Mini Mental State Exam 05/30/2020 02/28/2020  Orientation to time 5 5  Orientation to Place 5 4  Registration 3 3  Attention/ Calculation 5 3  Recall 3 0  Language- name 2 objects 2 2  Language- repeat 1 1  Language- follow 3 step command 3 3  Language- read & follow direction 1 1  Write a sentence 1 1  Copy design 0 1  Total score 29 24     Cranial nerves: Pupils are equal and briskly reactive to light. Funduscopic exam without evidence of pallor or edema. Extraocular movements  in vertical and horizontal planes intact and without nystagmus, normal saccades. .  Visual fields by finger perimetry are intact. Hearing to finger rub intact.  Facial sensation intact to fine touch. Facial motor strength is symmetric and tongue and uvula move midline. Tongue protrusion into either cheek  is normal. Shoulder shrug is normal.   Motor exam:  Normal tone ,muscle bulk and symmetric strength in all extremities.no fasciculation.   Sensory:  Fine touch, pinprick and vibration were tested in all extremities.   Proprioception intact.   Coordination: Rapid alternating movements in the fingers/hands were normal.  Finger-to-nose maneuver normal without evidence of ataxia, dysmetria or tremor.  Gait and station: Patient walks without assistive device . He turned with 4 steps.  Tandem gait is unfragmented.   Deep tendon reflexes: in the upper and lower extremities are attenuated- Babinski maneuver response is equivocal.   Assessment:  After physical and neurologic examination, review of laboratory studies, imaging, neurophysiology testing and pre-existing records, assessment is that of :   No longer any evidence of cognitive impairment, the initial situation was likely proceeded by dehydration and a possible heat stroke. He is fully recovered, no follow up necessary.    MD 05/30/2020             

## 2020-06-14 DIAGNOSIS — E1169 Type 2 diabetes mellitus with other specified complication: Secondary | ICD-10-CM | POA: Diagnosis not present

## 2020-06-14 DIAGNOSIS — E782 Mixed hyperlipidemia: Secondary | ICD-10-CM | POA: Diagnosis not present

## 2020-06-14 DIAGNOSIS — H6120 Impacted cerumen, unspecified ear: Secondary | ICD-10-CM | POA: Diagnosis not present

## 2020-06-14 DIAGNOSIS — Z131 Encounter for screening for diabetes mellitus: Secondary | ICD-10-CM | POA: Diagnosis not present

## 2020-06-14 DIAGNOSIS — E041 Nontoxic single thyroid nodule: Secondary | ICD-10-CM | POA: Diagnosis not present

## 2020-06-14 DIAGNOSIS — I10 Essential (primary) hypertension: Secondary | ICD-10-CM | POA: Diagnosis not present

## 2020-06-14 DIAGNOSIS — Z0000 Encounter for general adult medical examination without abnormal findings: Secondary | ICD-10-CM | POA: Diagnosis not present

## 2020-06-14 DIAGNOSIS — Z79899 Other long term (current) drug therapy: Secondary | ICD-10-CM | POA: Diagnosis not present

## 2020-06-14 DIAGNOSIS — K219 Gastro-esophageal reflux disease without esophagitis: Secondary | ICD-10-CM | POA: Diagnosis not present

## 2020-06-14 DIAGNOSIS — Z86010 Personal history of colonic polyps: Secondary | ICD-10-CM | POA: Diagnosis not present

## 2020-06-14 DIAGNOSIS — K760 Fatty (change of) liver, not elsewhere classified: Secondary | ICD-10-CM | POA: Diagnosis not present

## 2020-06-14 DIAGNOSIS — D472 Monoclonal gammopathy: Secondary | ICD-10-CM | POA: Diagnosis not present

## 2020-06-30 ENCOUNTER — Inpatient Hospital Stay (HOSPITAL_COMMUNITY): Admission: EM | Disposition: E | Payer: Self-pay | Source: Home / Self Care

## 2020-06-30 ENCOUNTER — Emergency Department (HOSPITAL_COMMUNITY): Payer: Medicare Other

## 2020-06-30 ENCOUNTER — Encounter (HOSPITAL_COMMUNITY): Payer: Self-pay

## 2020-06-30 ENCOUNTER — Inpatient Hospital Stay (HOSPITAL_COMMUNITY)
Admission: EM | Admit: 2020-06-30 | Discharge: 2020-07-03 | DRG: 025 | Disposition: E | Payer: Medicare Other | Attending: General Surgery | Admitting: General Surgery

## 2020-06-30 ENCOUNTER — Emergency Department (HOSPITAL_COMMUNITY): Payer: Medicare Other | Admitting: Registered Nurse

## 2020-06-30 DIAGNOSIS — E119 Type 2 diabetes mellitus without complications: Secondary | ICD-10-CM | POA: Diagnosis present

## 2020-06-30 DIAGNOSIS — S065XAA Traumatic subdural hemorrhage with loss of consciousness status unknown, initial encounter: Secondary | ICD-10-CM

## 2020-06-30 DIAGNOSIS — R402112 Coma scale, eyes open, never, at arrival to emergency department: Secondary | ICD-10-CM | POA: Diagnosis present

## 2020-06-30 DIAGNOSIS — I6200 Nontraumatic subdural hemorrhage, unspecified: Secondary | ICD-10-CM | POA: Diagnosis not present

## 2020-06-30 DIAGNOSIS — R404 Transient alteration of awareness: Secondary | ICD-10-CM | POA: Diagnosis not present

## 2020-06-30 DIAGNOSIS — Z043 Encounter for examination and observation following other accident: Secondary | ICD-10-CM | POA: Diagnosis not present

## 2020-06-30 DIAGNOSIS — W109XXA Fall (on) (from) unspecified stairs and steps, initial encounter: Secondary | ICD-10-CM | POA: Diagnosis present

## 2020-06-30 DIAGNOSIS — Z87891 Personal history of nicotine dependence: Secondary | ICD-10-CM

## 2020-06-30 DIAGNOSIS — R4182 Altered mental status, unspecified: Secondary | ICD-10-CM

## 2020-06-30 DIAGNOSIS — I10 Essential (primary) hypertension: Secondary | ICD-10-CM | POA: Diagnosis not present

## 2020-06-30 DIAGNOSIS — I4891 Unspecified atrial fibrillation: Secondary | ICD-10-CM | POA: Diagnosis present

## 2020-06-30 DIAGNOSIS — Z20822 Contact with and (suspected) exposure to covid-19: Secondary | ICD-10-CM | POA: Diagnosis present

## 2020-06-30 DIAGNOSIS — Y92009 Unspecified place in unspecified non-institutional (private) residence as the place of occurrence of the external cause: Secondary | ICD-10-CM

## 2020-06-30 DIAGNOSIS — Z515 Encounter for palliative care: Secondary | ICD-10-CM

## 2020-06-30 DIAGNOSIS — E876 Hypokalemia: Secondary | ICD-10-CM | POA: Diagnosis present

## 2020-06-30 DIAGNOSIS — D689 Coagulation defect, unspecified: Secondary | ICD-10-CM | POA: Diagnosis not present

## 2020-06-30 DIAGNOSIS — T1490XA Injury, unspecified, initial encounter: Secondary | ICD-10-CM

## 2020-06-30 DIAGNOSIS — Z888 Allergy status to other drugs, medicaments and biological substances status: Secondary | ICD-10-CM | POA: Diagnosis not present

## 2020-06-30 DIAGNOSIS — R402312 Coma scale, best motor response, none, at arrival to emergency department: Secondary | ICD-10-CM | POA: Diagnosis not present

## 2020-06-30 DIAGNOSIS — I499 Cardiac arrhythmia, unspecified: Secondary | ICD-10-CM | POA: Diagnosis not present

## 2020-06-30 DIAGNOSIS — S066X9A Traumatic subarachnoid hemorrhage with loss of consciousness of unspecified duration, initial encounter: Secondary | ICD-10-CM | POA: Diagnosis present

## 2020-06-30 DIAGNOSIS — S020XXA Fracture of vault of skull, initial encounter for closed fracture: Secondary | ICD-10-CM | POA: Diagnosis present

## 2020-06-30 DIAGNOSIS — E785 Hyperlipidemia, unspecified: Secondary | ICD-10-CM | POA: Diagnosis not present

## 2020-06-30 DIAGNOSIS — Z66 Do not resuscitate: Secondary | ICD-10-CM | POA: Diagnosis not present

## 2020-06-30 DIAGNOSIS — S43225A Posterior dislocation of left sternoclavicular joint, initial encounter: Secondary | ICD-10-CM | POA: Diagnosis not present

## 2020-06-30 DIAGNOSIS — G935 Compression of brain: Secondary | ICD-10-CM | POA: Diagnosis not present

## 2020-06-30 DIAGNOSIS — R579 Shock, unspecified: Secondary | ICD-10-CM | POA: Diagnosis not present

## 2020-06-30 DIAGNOSIS — R402212 Coma scale, best verbal response, none, at arrival to emergency department: Secondary | ICD-10-CM | POA: Diagnosis present

## 2020-06-30 DIAGNOSIS — Z9289 Personal history of other medical treatment: Secondary | ICD-10-CM

## 2020-06-30 DIAGNOSIS — J9811 Atelectasis: Secondary | ICD-10-CM | POA: Diagnosis not present

## 2020-06-30 DIAGNOSIS — S199XXA Unspecified injury of neck, initial encounter: Secondary | ICD-10-CM | POA: Diagnosis not present

## 2020-06-30 DIAGNOSIS — Z9889 Other specified postprocedural states: Secondary | ICD-10-CM

## 2020-06-30 DIAGNOSIS — D696 Thrombocytopenia, unspecified: Secondary | ICD-10-CM | POA: Diagnosis present

## 2020-06-30 DIAGNOSIS — S069XAA Unspecified intracranial injury with loss of consciousness status unknown, initial encounter: Secondary | ICD-10-CM | POA: Diagnosis present

## 2020-06-30 DIAGNOSIS — S299XXA Unspecified injury of thorax, initial encounter: Secondary | ICD-10-CM | POA: Diagnosis not present

## 2020-06-30 DIAGNOSIS — S065X9A Traumatic subdural hemorrhage with loss of consciousness of unspecified duration, initial encounter: Secondary | ICD-10-CM

## 2020-06-30 DIAGNOSIS — Z833 Family history of diabetes mellitus: Secondary | ICD-10-CM

## 2020-06-30 DIAGNOSIS — S3991XA Unspecified injury of abdomen, initial encounter: Secondary | ICD-10-CM | POA: Diagnosis not present

## 2020-06-30 DIAGNOSIS — W19XXXA Unspecified fall, initial encounter: Secondary | ICD-10-CM

## 2020-06-30 DIAGNOSIS — Z7984 Long term (current) use of oral hypoglycemic drugs: Secondary | ICD-10-CM | POA: Diagnosis not present

## 2020-06-30 DIAGNOSIS — S066X0A Traumatic subarachnoid hemorrhage without loss of consciousness, initial encounter: Secondary | ICD-10-CM | POA: Diagnosis not present

## 2020-06-30 DIAGNOSIS — R4020 Unspecified coma: Secondary | ICD-10-CM | POA: Diagnosis not present

## 2020-06-30 DIAGNOSIS — R0689 Other abnormalities of breathing: Secondary | ICD-10-CM | POA: Diagnosis not present

## 2020-06-30 DIAGNOSIS — Z4682 Encounter for fitting and adjustment of non-vascular catheter: Secondary | ICD-10-CM | POA: Diagnosis not present

## 2020-06-30 DIAGNOSIS — S065X0A Traumatic subdural hemorrhage without loss of consciousness, initial encounter: Secondary | ICD-10-CM | POA: Diagnosis not present

## 2020-06-30 HISTORY — PX: CRANIOTOMY: SHX93

## 2020-06-30 LAB — RESPIRATORY PANEL BY RT PCR (FLU A&B, COVID)
Influenza A by PCR: NEGATIVE
Influenza B by PCR: NEGATIVE
SARS Coronavirus 2 by RT PCR: NEGATIVE

## 2020-06-30 LAB — COMPREHENSIVE METABOLIC PANEL
ALT: 33 U/L (ref 0–44)
AST: 50 U/L — ABNORMAL HIGH (ref 15–41)
Albumin: 3.8 g/dL (ref 3.5–5.0)
Alkaline Phosphatase: 65 U/L (ref 38–126)
Anion gap: 13 (ref 5–15)
BUN: 13 mg/dL (ref 8–23)
CO2: 23 mmol/L (ref 22–32)
Calcium: 8.5 mg/dL — ABNORMAL LOW (ref 8.9–10.3)
Chloride: 103 mmol/L (ref 98–111)
Creatinine, Ser: 0.74 mg/dL (ref 0.61–1.24)
GFR, Estimated: >60 mL/min (ref >60)
Glucose, Bld: 190 mg/dL — ABNORMAL HIGH (ref 70–99)
Potassium: 3.3 mmol/L — ABNORMAL LOW (ref 3.5–5.1)
Sodium: 139 mmol/L (ref 135–145)
Total Bilirubin: 0.7 mg/dL (ref 0.3–1.2)
Total Protein: 7.4 g/dL (ref 6.5–8.1)

## 2020-06-30 LAB — SAMPLE TO BLOOD BANK

## 2020-06-30 LAB — CBC
HCT: 36.4 % — ABNORMAL LOW (ref 39.0–52.0)
Hemoglobin: 11.4 g/dL — ABNORMAL LOW (ref 13.0–17.0)
MCH: 30.8 pg (ref 26.0–34.0)
MCHC: 31.3 g/dL (ref 30.0–36.0)
MCV: 98.4 fL (ref 80.0–100.0)
Platelets: 198 10*3/uL (ref 150–400)
RBC: 3.7 MIL/uL — ABNORMAL LOW (ref 4.22–5.81)
RDW: 12.7 % (ref 11.5–15.5)
WBC: 8.4 10*3/uL (ref 4.0–10.5)
nRBC: 0 % (ref 0.0–0.2)

## 2020-06-30 LAB — PROTIME-INR
INR: 1.3 — ABNORMAL HIGH (ref 0.8–1.2)
Prothrombin Time: 15.7 seconds — ABNORMAL HIGH (ref 11.4–15.2)

## 2020-06-30 LAB — LACTIC ACID, PLASMA: Lactic Acid, Venous: 3.7 mmol/L (ref 0.5–1.9)

## 2020-06-30 LAB — ETHANOL: Alcohol, Ethyl (B): 305 mg/dL (ref <10)

## 2020-06-30 SURGERY — CRANIOTOMY HEMATOMA EVACUATION SUBDURAL
Anesthesia: General | Laterality: Right

## 2020-06-30 MED ORDER — IOHEXOL 300 MG/ML  SOLN
100.0000 mL | Freq: Once | INTRAMUSCULAR | Status: AC | PRN
  Administered 2020-06-30: 100 mL via INTRAVENOUS

## 2020-06-30 MED ORDER — PROPOFOL 1000 MG/100ML IV EMUL
INTRAVENOUS | Status: AC
  Administered 2020-06-30: 10 ug/kg/min via INTRAVENOUS
  Filled 2020-06-30: qty 100

## 2020-06-30 MED ORDER — THROMBIN 20000 UNITS EX SOLR
CUTANEOUS | Status: AC
  Filled 2020-06-30: qty 20000

## 2020-06-30 MED ORDER — FENTANYL CITRATE (PF) 100 MCG/2ML IJ SOLN
INTRAMUSCULAR | Status: AC | PRN
Start: 2020-06-30 — End: 2020-06-30
  Administered 2020-06-30: 50 ug via INTRAVENOUS

## 2020-06-30 MED ORDER — CEFAZOLIN SODIUM-DEXTROSE 2-4 GM/100ML-% IV SOLN
2.0000 g | Freq: Once | INTRAVENOUS | Status: AC
  Administered 2020-06-30: 2 g via INTRAVENOUS
  Filled 2020-06-30: qty 100

## 2020-06-30 MED ORDER — THROMBIN 5000 UNITS EX SOLR
CUTANEOUS | Status: AC
  Filled 2020-06-30: qty 5000

## 2020-06-30 MED ORDER — SODIUM CHLORIDE 3 % IV BOLUS
250.0000 mL | Freq: Once | INTRAVENOUS | Status: DC
  Filled 2020-06-30: qty 250

## 2020-06-30 MED ORDER — POTASSIUM CHLORIDE 10 MEQ/100ML IV SOLN
10.0000 meq | INTRAVENOUS | Status: DC
  Administered 2020-06-30: 10 meq via INTRAVENOUS
  Filled 2020-06-30 (×2): qty 100

## 2020-06-30 MED ORDER — LIDOCAINE-EPINEPHRINE 1 %-1:100000 IJ SOLN
INTRAMUSCULAR | Status: AC
  Filled 2020-06-30: qty 1

## 2020-06-30 MED ORDER — FENTANYL CITRATE (PF) 250 MCG/5ML IJ SOLN
INTRAMUSCULAR | Status: AC
  Filled 2020-06-30: qty 5

## 2020-06-30 MED ORDER — ETOMIDATE 2 MG/ML IV SOLN
INTRAVENOUS | Status: AC | PRN
  Administered 2020-06-30: 20 mg via INTRAVENOUS

## 2020-06-30 MED ORDER — SUCCINYLCHOLINE CHLORIDE 20 MG/ML IJ SOLN
INTRAMUSCULAR | Status: AC | PRN
  Administered 2020-06-30: 150 mg via INTRAVENOUS

## 2020-06-30 MED ORDER — BACITRACIN ZINC 500 UNIT/GM EX OINT
TOPICAL_OINTMENT | CUTANEOUS | Status: AC
  Filled 2020-06-30: qty 28.35

## 2020-06-30 MED ORDER — FENTANYL CITRATE (PF) 100 MCG/2ML IJ SOLN
INTRAMUSCULAR | Status: AC
  Filled 2020-06-30: qty 2

## 2020-06-30 MED ORDER — PROPOFOL 1000 MG/100ML IV EMUL: 5.0000 ug/kg/min | INTRAVENOUS | Status: DC

## 2020-06-30 MED ORDER — PROPOFOL 10 MG/ML IV BOLUS
INTRAVENOUS | Status: AC
  Filled 2020-06-30: qty 20

## 2020-06-30 SURGICAL SUPPLY — 78 items
BAND RUBBER #18 3X1/16 STRL (MISCELLANEOUS) ×6 IMPLANT
BIT DRILL WIRE PASS 1.3MM (BIT) ×1 IMPLANT
BLADE CLIPPER SURG (BLADE) ×3 IMPLANT
BLADE SURG 11 STRL SS (BLADE) ×3 IMPLANT
BNDG COHESIVE 4X5 TAN STRL (GAUZE/BANDAGES/DRESSINGS) IMPLANT
BNDG STRETCH 4X75 STRL LF (GAUZE/BANDAGES/DRESSINGS) IMPLANT
BUR ACORN 9.0 PRECISION (BURR) ×2 IMPLANT
BUR ACORN 9.0MM PRECISION (BURR) ×1
BUR SPIRAL ROUTER 2.3 (BUR) ×2 IMPLANT
BUR SPIRAL ROUTER 2.3MM (BUR) ×1
CANISTER SUCT 3000ML PPV (MISCELLANEOUS) ×3 IMPLANT
CARTRIDGE OIL MAESTRO DRILL (MISCELLANEOUS) ×1 IMPLANT
CLIP RANEY DISP (INSTRUMENTS) ×3 IMPLANT
CLIP VESOCCLUDE MED 6/CT (CLIP) ×3 IMPLANT
COVER BACK TABLE 60X90IN (DRAPES) ×6 IMPLANT
COVER WAND RF STERILE (DRAPES) ×3 IMPLANT
DECANTER SPIKE VIAL GLASS SM (MISCELLANEOUS) ×3 IMPLANT
DERMABOND ADVANCED (GAUZE/BANDAGES/DRESSINGS) ×2
DERMABOND ADVANCED .7 DNX12 (GAUZE/BANDAGES/DRESSINGS) ×1 IMPLANT
DIFFUSER DRILL AIR PNEUMATIC (MISCELLANEOUS) ×3 IMPLANT
DRAPE INCISE IOBAN 66X45 STRL (DRAPES) ×3 IMPLANT
DRAPE NEUROLOGICAL W/INCISE (DRAPES) ×3 IMPLANT
DRAPE SURG 17X23 STRL (DRAPES) IMPLANT
DRAPE WARM FLUID 44X44 (DRAPES) ×3 IMPLANT
DRILL WIRE PASS 1.3MM (BIT) ×3
DRSG OPSITE 4X5.5 SM (GAUZE/BANDAGES/DRESSINGS) ×9 IMPLANT
ELECT REM PT RETURN 9FT ADLT (ELECTROSURGICAL) ×3
ELECTRODE REM PT RTRN 9FT ADLT (ELECTROSURGICAL) ×1 IMPLANT
GAUZE 4X4 16PLY RFD (DISPOSABLE) IMPLANT
GAUZE SPONGE 4X4 12PLY STRL (GAUZE/BANDAGES/DRESSINGS) ×3 IMPLANT
GAUZE SPONGE 4X4 12PLY STRL LF (GAUZE/BANDAGES/DRESSINGS) ×6 IMPLANT
GAUZE SPONGE 4X4 16PLY XRAY LF (GAUZE/BANDAGES/DRESSINGS) ×3 IMPLANT
GLOVE BIO SURGEON STRL SZ7 (GLOVE) IMPLANT
GLOVE BIO SURGEON STRL SZ8 (GLOVE) ×6 IMPLANT
GLOVE BIOGEL PI IND STRL 7.0 (GLOVE) ×2 IMPLANT
GLOVE BIOGEL PI INDICATOR 7.0 (GLOVE) ×4
GLOVE EXAM NITRILE XL STR (GLOVE) IMPLANT
GLOVE INDICATOR 8.5 STRL (GLOVE) ×6 IMPLANT
GOWN STRL REUS W/ TWL LRG LVL3 (GOWN DISPOSABLE) ×3 IMPLANT
GOWN STRL REUS W/ TWL XL LVL3 (GOWN DISPOSABLE) ×3 IMPLANT
GOWN STRL REUS W/TWL 2XL LVL3 (GOWN DISPOSABLE) IMPLANT
GOWN STRL REUS W/TWL LRG LVL3 (GOWN DISPOSABLE) ×6
GOWN STRL REUS W/TWL XL LVL3 (GOWN DISPOSABLE) ×6
HEMOSTAT POWDER KIT SURGIFOAM (HEMOSTASIS) ×3 IMPLANT
HEMOSTAT SURGICEL 2X14 (HEMOSTASIS) IMPLANT
KIT BASIN OR (CUSTOM PROCEDURE TRAY) ×3 IMPLANT
KIT TURNOVER KIT B (KITS) ×3 IMPLANT
NEEDLE HYPO 25X1 1.5 SAFETY (NEEDLE) ×3 IMPLANT
NS IRRIG 1000ML POUR BTL (IV SOLUTION) ×3 IMPLANT
OIL CARTRIDGE MAESTRO DRILL (MISCELLANEOUS) ×3
PACK CRANIOTOMY CUSTOM (CUSTOM PROCEDURE TRAY) ×3 IMPLANT
PAD ARMBOARD 7.5X6 YLW CONV (MISCELLANEOUS) ×3 IMPLANT
PATTIES SURGICAL .25X.25 (GAUZE/BANDAGES/DRESSINGS) IMPLANT
PATTIES SURGICAL .5 X.5 (GAUZE/BANDAGES/DRESSINGS) IMPLANT
PATTIES SURGICAL .5 X3 (DISPOSABLE) IMPLANT
PATTIES SURGICAL 1X1 (DISPOSABLE) ×6 IMPLANT
PIN MAYFIELD SKULL DISP (PIN) IMPLANT
PLATE CMF LG 6H (Plate) ×3 IMPLANT
PLATE DOUBLE Y CMF 6H (Plate) ×3 IMPLANT
SCREW UNIII AXS SD 1.5X4 (Screw) ×24 IMPLANT
SPONGE NEURO XRAY DETECT 1X3 (DISPOSABLE) ×6 IMPLANT
SPONGE SURGIFOAM ABS GEL 100 (HEMOSTASIS) ×6 IMPLANT
STAPLER SKIN PROX WIDE 3.9 (STAPLE) IMPLANT
STAPLER VISISTAT 35W (STAPLE) ×6 IMPLANT
SUT BONE WAX W31G (SUTURE) ×3 IMPLANT
SUT ETHILON 3 0 FSL (SUTURE) ×3 IMPLANT
SUT NURALON 4 0 TR CR/8 (SUTURE) ×6 IMPLANT
SUT SILK  1 MH (SUTURE) ×6
SUT SILK 1 MH (SUTURE) ×3 IMPLANT
SUT VIC AB 2-0 CT1 18 (SUTURE) ×9 IMPLANT
SUT VIC AB 4-0 PS2 27 (SUTURE) ×3 IMPLANT
SYR CONTROL 10ML LL (SYRINGE) ×3 IMPLANT
TAPE CLOTH SURG 4X10 WHT LF (GAUZE/BANDAGES/DRESSINGS) ×3 IMPLANT
TOWEL GREEN STERILE (TOWEL DISPOSABLE) ×3 IMPLANT
TOWEL GREEN STERILE FF (TOWEL DISPOSABLE) ×3 IMPLANT
TRAY FOLEY MTR SLVR 16FR STAT (SET/KITS/TRAYS/PACK) ×3 IMPLANT
UNDERPAD 30X36 HEAVY ABSORB (UNDERPADS AND DIAPERS) IMPLANT
WATER STERILE IRR 1000ML POUR (IV SOLUTION) ×3 IMPLANT

## 2020-06-30 NOTE — Progress Notes (Signed)
RT note. Pt. Transferred to CT and back without any complications.

## 2020-06-30 NOTE — ED Notes (Signed)
Pt comes via [facility] after a fall down a flight of stairs, found by wife, large hematoma to back of head, GCS 3, sinus brady in the 30's, currently being paced.

## 2020-06-30 NOTE — Consult Note (Signed)
Reason for Consult: Subdural hematoma Referring Physician: Trauma  Ryan Rosales is an 69 y.o. male.  HPI: 69 year old with fall at home found down unknown period of time brought to emergency room with comatose was intubated paralyzed and sedated CT scan showed a large acute subdural hematoma reportedly patient was at his baseline prior to his fall is unknown how long he was down the only exam we have is after paralytics he does have a weak gag  Past Medical History:  Diagnosis Date  . Diabetes mellitus without complication ([place])   . Hyperlipidemia   . Hypertension     Past Surgical History:  Procedure Laterality Date  . ROTATOR CUFF REPAIR      Family History  Problem Relation Age of Onset  . Diabetes Mother     Social History:  reports that he quit smoking about 57 years ago. His smoking use included cigarettes. He has never used smokeless tobacco. He reports current alcohol use. He reports previous drug use.  Allergies:  Allergies  Allergen Reactions  . Lisinopril Swelling and Other (See Comments)    Lips became swollen  . Crestor [Rosuvastatin Calcium]     Agitated and irritable easily  . Hydrochlorothiazide Nausea Only    Medications: I have reviewed the patient's current medications.  Results for orders placed or performed during the hospital encounter of 06/06/2020 (from the past 48 hour(s))  Comprehensive metabolic panel     Status: Abnormal   Collection Time: 06/13/2020 10:32 PM  Result Value Ref Range   Sodium 139 135 - 145 mmol/L   Potassium 3.3 (L) 3.5 - 5.1 mmol/L   Chloride 103 98 - 111 mmol/L   CO2 23 22 - 32 mmol/L   Glucose, Bld 190 (H) 70 - 99 mg/dL    Comment: Glucose reference range applies only to samples taken after fasting for at least 8 hours.   BUN 13 8 - 23 mg/dL   Creatinine, Ser 0.74 0.61 - 1.24 mg/dL   Calcium 8.5 (L) 8.9 - 10.3 mg/dL   Total Protein 7.4 6.5 - 8.1 g/dL   Albumin 3.8 3.5 - 5.0 g/dL   AST 50 (H) 15 - 41 U/L   ALT 33 0 - 44  U/L   Alkaline Phosphatase 65 38 - 126 U/L   Total Bilirubin 0.7 0.3 - 1.2 mg/dL   GFR, Estimated >60 >60 mL/min    Comment: (NOTE) Calculated using the CKD-EPI Creatinine Equation (2021)    Anion gap 13 5 - 15    Comment: Performed at [facility] [address], [place], [place] [address]  CBC     Status: Abnormal   Collection Time: 06/29/2020 10:32 PM  Result Value Ref Range   WBC 8.4 4.0 - 10.5 K/uL   RBC 3.70 (L) 4.22 - 5.81 MIL/uL   Hemoglobin 11.4 (L) 13.0 - 17.0 g/dL   HCT 36.4 (L) 39 - 52 %   MCV 98.4 80.0 - 100.0 fL   MCH 30.8 26.0 - 34.0 pg   MCHC 31.3 30.0 - 36.0 g/dL   RDW 12.7 11.5 - 15.5 %   Platelets 198 150 - 400 K/uL   nRBC 0.0 0.0 - 0.2 %    Comment: Performed at [facility], [place] [address], [place], [place] [address]  Ethanol     Status: Abnormal   Collection Time: 06/11/2020 10:32 PM  Result Value Ref Range   Alcohol, Ethyl (B) 305 (HH) <10 mg/dL    Comment:  CRITICAL RESULT CALLED TO, READ BACK BY AND VERIFIED WITH: RN J FERRAINLLO @2323  06/19/2020 BY S GEZAHEGN (NOTE) Lowest detectable limit for serum alcohol is 10 mg/dL.  For medical purposes only. Performed at [facility], [place] [address], [place], [place] [address]   Lactic acid, plasma     Status: Abnormal   Collection Time: 06/07/2020 10:32 PM  Result Value Ref Range   Lactic Acid, Venous 3.7 (HH) 0.5 - 1.9 mmol/L    Comment: CRITICAL RESULT CALLED TO, READ BACK BY AND VERIFIED WITH: RN J FERRAINLLO @2325  06/08/2020 BY S GEZAHEGN Performed at [facility], [place] [address], [place], [place] [address]   Protime-INR     Status: Abnormal   Collection Time: 06/14/2020 10:32 PM  Result Value Ref Range   Prothrombin Time 15.7 (H) 11.4 - 15.2 seconds   INR 1.3 (H) 0.8 - 1.2    Comment: (NOTE) INR goal varies based on device and disease states. Performed at [facility], [place] [address], [place], [place] [address]   Sample to Blood Bank     Status: None   Collection Time:  06/02/2020 10:36 PM  Result Value Ref Range   Blood Bank Specimen SAMPLE AVAILABLE FOR TESTING    Sample Expiration      07/19/20,2359 Performed at [facility], [place] [address], [place], [place] [address]     DG Pelvis Portable  Result Date: 06/29/2020 CLINICAL DATA:  Fall, unresponsive EXAM: PORTABLE PELVIS 1-2 VIEWS COMPARISON:  None. FINDINGS: There is no evidence of pelvic fracture or diastasis. No pelvic bone lesions are seen. IMPRESSION: Negative. Electronically Signed   By: Fidela Salisbury MD   On: 06/26/2020 22:48   CT CHEST ABDOMEN PELVIS W CONTRAST  Addendum Date: 06/04/2020   ADDENDUM REPORT: 06/10/2020 23:20 ADDENDUM: Results were discussed with Dr. Donne Hazel at 11:12 p.m. [place] on June 30, 2020. Electronically Signed   By: Virgina Norfolk M.D.   On: 06/03/2020 23:20   Result Date: 06/15/2020 CLINICAL DATA:  Status post fall. EXAM: CT CHEST, ABDOMEN, AND PELVIS WITH CONTRAST TECHNIQUE: Multidetector CT imaging of the chest, abdomen and pelvis was performed following the standard protocol during bolus administration of intravenous contrast. CONTRAST:  152mL OMNIPAQUE IOHEXOL 300 MG/ML  SOLN COMPARISON:  None. FINDINGS: CT CHEST FINDINGS Cardiovascular: There is mild calcification of the aortic arch. Normal heart size with marked severity coronary artery calcification. No pericardial effusion. Mediastinum/Nodes: A 7.1 cm x 3.6 cm isodense area is seen within the anterior mediastinum. There is a moderate amount of surrounding inflammatory fat stranding. This extends from the level of the clavicles to the level of the main pulmonary artery. A tiny, thin curvilinear area of contrast attenuation is seen anteriorly (axial CT images 24 and 25, CT series number 3). No enlarged mediastinal, hilar or axillary lymph nodes are seen. The thyroid gland, trachea and esophagus demonstrate no abnormalities. Lungs/Pleura: An endotracheal tube is seen. Mild atelectasis is seen along the  posterior aspect of the bilateral lower lobes. There is no evidence of a pleural effusion or pneumothorax. Musculoskeletal: Posterior dislocation of the head of the left clavicle is seen. Multilevel degenerative changes are noted throughout the thoracic spine. CT ABDOMEN PELVIS FINDINGS Hepatobiliary: No focal liver abnormality is seen. No gallstones, gallbladder wall thickening, or biliary dilatation. Pancreas: Unremarkable. No pancreatic ductal dilatation or surrounding inflammatory changes. Spleen: Normal in size without focal abnormality. Adrenals/Urinary Tract: Adrenal glands are unremarkable. Kidneys are normal, without renal calculi,  focal lesion, or hydronephrosis. Bladder is unremarkable. Stomach/Bowel: Stomach is within normal limits. Appendix appears normal. No evidence of bowel dilatation. Noninflamed diverticula are seen throughout the large bowel. Vascular/Lymphatic: There is moderate severity calcification of the abdominal aorta and bilateral common iliac arteries, without evidence of aneurysmal dilatation. No enlarged abdominal or pelvic lymph nodes. Reproductive: The prostate gland is moderately enlarged. Other: No abdominal wall hernia or abnormality. No abdominopelvic ascites. Musculoskeletal: Degenerative changes are seen throughout the lumbar spine, with a chronic compression fracture deformity noted at the level of L1. IMPRESSION: 1. Large anterior mediastinal hematoma, with additional findings suggestive of active bleeding. 2. Posterior dislocation of the head of the left clavicle. Subsequent vascular injury is suspected, resulting in the previously noted anterior mediastinal hematoma. 3. Mild bilateral lower lobe atelectasis. 4. Colonic diverticulosis without evidence of acute diverticulitis. 5. Enlarged prostate gland. 6. Chronic compression fracture deformity at the level of L1. 7. Aortic atherosclerosis. Aortic Atherosclerosis (ICD10-I70.0). Electronically Signed: By: Virgina Norfolk  M.D. On: 06/03/2020 23:17   DG Chest Port 1 View  Result Date: 06/07/2020 CLINICAL DATA:  Level 1 trauma, fall, unresponsive EXAM: PORTABLE CHEST 1 VIEW COMPARISON:  None. FINDINGS: Endotracheal tube is seen with its tip within the right mainstem bronchus. Mild left basilar atelectasis. No pneumothorax or pleural effusion. Cardiac size within normal limits. Pulmonary vascularity is normal. No acute bone abnormality. IMPRESSION: Right mainstem intubation. Withdrawal of the a endotracheal tube by at least 3.5 cm would better position the tube. Attempts are being made to contact the managing physician at this time. Electronically Signed   By: Fidela Salisbury MD   On: 06/08/2020 22:47    Review of Systems  Unable to perform ROS: Intubated   Blood pressure (!) 156/87, pulse (!) 54, temperature (!) 97.3 F (36.3 C), resp. rate 19, height 5\' 8"  (1.727 m), weight 90.7 kg, SpO2 100 %. Physical Exam Neurological:     Comments: Intubated pupils fixed and dilated no corneal does have a gag no movement to noxious stimulation however did receive paralytics within the last hour so do not really have a reliable exam off medications.  Patient was reportedly healthy at baseline with just hypertension history prior to event     Assessment/Plan: Six 33-year-old acute subdural hematoma will be taking to the OR emergently for craniotomy for evacuation.  Prognosis extremely grave  Elaina Hoops 06/14/2020, 11:27 PM

## 2020-06-30 NOTE — ED Provider Notes (Signed)
[facility] EMERGENCY DEPARTMENT Provider Note   CSN: [id number] Arrival date & time: 06/05/2020  2212     History Chief Complaint  Patient presents with  . Fall    Ryan Rosales is a 69 y.o. male.  Patient is a 69 year old male with history of diabetes, hypertension, hyperlipidemia.  Patient brought by EMS for evaluation of fall and altered mental status.  The story as it was relayed to me is that this patient was walking down the stairs when he fell.  The wife heard him fall and checked on him.  He was found at the bottom of the stairs not responding.  EMS was called and patient was found to initially have a low heart rate.  He was started on the external pacer, then transported here.  Patient GCS of 3 in route.  He is being ventilated with bag valve mask.  The history is provided by the patient.       Past Medical History:  Diagnosis Date  . Diabetes mellitus without complication ([place])   . Hyperlipidemia   . Hypertension     Patient Active Problem List   Diagnosis Date Noted  . Heat stroke and sunstroke 05/30/2020  . HTN (hypertension) 05/19/2013    Past Surgical History:  Procedure Laterality Date  . ROTATOR CUFF REPAIR         Family History  Problem Relation Age of Onset  . Diabetes Mother     Social History   Tobacco Use  . Smoking status: Former Smoker    Types: Cigarettes    Quit date: 10/22/1962    Years since quitting: 57.7  . Smokeless tobacco: Never Used  Substance Use Topics  . Alcohol use: Yes  . Drug use: Not Currently    Home Medications Prior to Admission medications   Medication Sig Start Date End Date Taking? Authorizing Provider  ALPRAZolam Duanne Moron) 1 MG tablet As needed for MRI 02/28/20   Dohmeier, Asencion Partridge, MD  amLODipine (NORVASC) 10 MG tablet Take 10 mg by mouth daily.    [provider]  aspirin (BAYER LOW DOSE) 81 MG EC tablet Take 81 mg by mouth daily. Swallow whole.    [provider]  B  Complex-C (B-COMPLEX WITH VITAMIN C) tablet Take 1 tablet by mouth daily.    [provider]  carvedilol (COREG) 3.125 MG tablet Take 3.125 mg by mouth 2 (two) times daily. 12/23/19   [provider]  EPINEPHrine (EPIPEN) 0.3 mg/0.3 mL DEVI Inject 0.3 mLs (0.3 mg total) into the muscle once. Patient taking differently: Inject 0.3 mg into the muscle once as needed (for anaphylaxis).  10/23/11   Hayden Rasmussen, MD  losartan (COZAAR) 50 MG tablet Take 50 mg by mouth daily. 04/06/17   [provider]  NON FORMULARY Take 1 tablet by mouth See admin instructions. Focus Factor tablets; Take 1 tablet by mouth once a day    [provider]  Omega-3 Fatty Acids (FISH OIL PO) Take 2 capsules by mouth 2 (two) times daily.     [provider]  omeprazole (PRILOSEC) 20 MG capsule Take 20 mg by mouth daily as needed (for GERD-like symptoms).    [provider]    Allergies    Lisinopril, Crestor [rosuvastatin calcium], and Hydrochlorothiazide  Review of Systems   Review of Systems  Unable to perform ROS: Acuity of condition    Physical Exam Updated Vital Signs BP (!) 156/87   Pulse (!) 54  Temp (!) 97.3 F (36.3 C)   Resp 19   Ht 5\' 8"  (1.727 m)   Wt 90.7 kg   SpO2 100%   BMI 30.41 kg/m   Physical Exam Vitals and nursing note reviewed.  Constitutional:      Appearance: He is well-developed.     Comments: Patient is an acutely ill-appearing male.  GCS is 3.  HENT:     Head: Normocephalic.     Comments: There are contusions and abrasions to the scalp. Eyes:     Pupils: Pupils are equal, round, and reactive to light.     Comments: Pupils are 5 mm and fixed.  Neck:     Comments: Neck immobilized in cervical collar Cardiovascular:     Rate and Rhythm: Tachycardia present. Rhythm irregular.     Heart sounds: No murmur heard.  No friction rub.  Musculoskeletal:        General: Normal range of motion.  Skin:    General: Skin is warm and  dry.  Neurological:     Comments: Patient is GCS 3.  He is making no response to noxious stimuli or loud voice.     ED Results / Procedures / Treatments   Labs (all labs ordered are listed, but only abnormal results are displayed) Labs Reviewed  COMPREHENSIVE METABOLIC PANEL - Abnormal; Notable for the following components:      Result Value   Potassium 3.3 (*)    Glucose, Bld 190 (*)    Calcium 8.5 (*)    AST 50 (*)    All other components within normal limits  CBC - Abnormal; Notable for the following components:   RBC 3.70 (*)    Hemoglobin 11.4 (*)    HCT 36.4 (*)    All other components within normal limits  ETHANOL - Abnormal; Notable for the following components:   Alcohol, Ethyl (B) 305 (*)    All other components within normal limits  LACTIC ACID, PLASMA - Abnormal; Notable for the following components:   Lactic Acid, Venous 3.7 (*)    All other components within normal limits  PROTIME-INR - Abnormal; Notable for the following components:   Prothrombin Time 15.7 (*)    INR 1.3 (*)    All other components within normal limits  RESPIRATORY PANEL BY RT PCR (FLU A&B, COVID)  URINALYSIS, ROUTINE W REFLEX MICROSCOPIC  TRAUMA TEG PANEL  I-STAT CHEM 8, ED  SAMPLE TO BLOOD BANK    EKG None  Radiology DG Pelvis Portable  Result Date: 06/29/2020 CLINICAL DATA:  Fall, unresponsive EXAM: PORTABLE PELVIS 1-2 VIEWS COMPARISON:  None. FINDINGS: There is no evidence of pelvic fracture or diastasis. No pelvic bone lesions are seen. IMPRESSION: Negative. Electronically Signed   By: Fidela Salisbury MD   On: 06/10/2020 22:48   CT CHEST ABDOMEN PELVIS W CONTRAST  Addendum Date: 06/08/2020   ADDENDUM REPORT: 06/06/2020 23:20 ADDENDUM: Results were discussed with Dr. Donne Hazel at 11:12 p.m. [place] on June 30, 2020. Electronically Signed   By: Virgina Norfolk M.D.   On: 06/02/2020 23:20   Result Date: 06/29/2020 CLINICAL DATA:  Status post fall. EXAM: CT CHEST, ABDOMEN, AND  PELVIS WITH CONTRAST TECHNIQUE: Multidetector CT imaging of the chest, abdomen and pelvis was performed following the standard protocol during bolus administration of intravenous contrast. CONTRAST:  131mL OMNIPAQUE IOHEXOL 300 MG/ML  SOLN COMPARISON:  None. FINDINGS: CT CHEST FINDINGS Cardiovascular: There is mild calcification of the aortic arch. Normal heart size with marked severity  coronary artery calcification. No pericardial effusion. Mediastinum/Nodes: A 7.1 cm x 3.6 cm isodense area is seen within the anterior mediastinum. There is a moderate amount of surrounding inflammatory fat stranding. This extends from the level of the clavicles to the level of the main pulmonary artery. A tiny, thin curvilinear area of contrast attenuation is seen anteriorly (axial CT images 24 and 25, CT series number 3). No enlarged mediastinal, hilar or axillary lymph nodes are seen. The thyroid gland, trachea and esophagus demonstrate no abnormalities. Lungs/Pleura: An endotracheal tube is seen. Mild atelectasis is seen along the posterior aspect of the bilateral lower lobes. There is no evidence of a pleural effusion or pneumothorax. Musculoskeletal: Posterior dislocation of the head of the left clavicle is seen. Multilevel degenerative changes are noted throughout the thoracic spine. CT ABDOMEN PELVIS FINDINGS Hepatobiliary: No focal liver abnormality is seen. No gallstones, gallbladder wall thickening, or biliary dilatation. Pancreas: Unremarkable. No pancreatic ductal dilatation or surrounding inflammatory changes. Spleen: Normal in size without focal abnormality. Adrenals/Urinary Tract: Adrenal glands are unremarkable. Kidneys are normal, without renal calculi, focal lesion, or hydronephrosis. Bladder is unremarkable. Stomach/Bowel: Stomach is within normal limits. Appendix appears normal. No evidence of bowel dilatation. Noninflamed diverticula are seen throughout the large bowel. Vascular/Lymphatic: There is moderate  severity calcification of the abdominal aorta and bilateral common iliac arteries, without evidence of aneurysmal dilatation. No enlarged abdominal or pelvic lymph nodes. Reproductive: The prostate gland is moderately enlarged. Other: No abdominal wall hernia or abnormality. No abdominopelvic ascites. Musculoskeletal: Degenerative changes are seen throughout the lumbar spine, with a chronic compression fracture deformity noted at the level of L1. IMPRESSION: 1. Large anterior mediastinal hematoma, with additional findings suggestive of active bleeding. 2. Posterior dislocation of the head of the left clavicle. Subsequent vascular injury is suspected, resulting in the previously noted anterior mediastinal hematoma. 3. Mild bilateral lower lobe atelectasis. 4. Colonic diverticulosis without evidence of acute diverticulitis. 5. Enlarged prostate gland. 6. Chronic compression fracture deformity at the level of L1. 7. Aortic atherosclerosis. Aortic Atherosclerosis (ICD10-I70.0). Electronically Signed: By: Virgina Norfolk M.D. On: 06/21/2020 23:17   DG Chest Port 1 View  Result Date: 06/20/2020 CLINICAL DATA:  Level 1 trauma, fall, unresponsive EXAM: PORTABLE CHEST 1 VIEW COMPARISON:  None. FINDINGS: Endotracheal tube is seen with its tip within the right mainstem bronchus. Mild left basilar atelectasis. No pneumothorax or pleural effusion. Cardiac size within normal limits. Pulmonary vascularity is normal. No acute bone abnormality. IMPRESSION: Right mainstem intubation. Withdrawal of the a endotracheal tube by at least 3.5 cm would better position the tube. Attempts are being made to contact the managing physician at this time. Electronically Signed   By: Fidela Salisbury MD   On: 06/29/2020 22:47   DG Abd Portable 1 View  Result Date: 06/23/2020 CLINICAL DATA:  69 year old male with NG placement. EXAM: PORTABLE ABDOMEN - 1 VIEW COMPARISON:  CT abdomen pelvis dated 06/19/2020. FINDINGS: Partially visualized  enteric tube with tip in the distal body of the stomach. No bowel dilatation. Excreted contrast noted in the renal collecting systems and urinary bladder. The osseous structures and soft tissues are unremarkable. IMPRESSION: Enteric tube with tip in the distal body of the stomach. Electronically Signed   By: Anner Crete M.D.   On: 06/06/2020 23:27    Procedures Procedures (including critical care time)  Medications Ordered in ED Medications  propofol (DIPRIVAN) 1000 MG/100ML infusion (10 mcg/kg/min  90.7 kg Intravenous New Bag/Given 06/07/2020 2238)  potassium chloride 10 mEq in  100 mL IVPB (10 mEq Intravenous New Bag/Given 06/14/2020 2331)  ceFAZolin (ANCEF) IVPB 2g/100 mL premix (2 g Intravenous New Bag/Given 06/26/2020 2332)  sodium chloride 3% (hypertonic) IV bolus 250 mL (has no administration in time range)  etomidate (AMIDATE) injection (20 mg Intravenous Given 06/04/2020 2220)  succinylcholine (ANECTINE) injection (150 mg Intravenous Given 06/22/2020 2220)  fentaNYL (SUBLIMAZE) injection ( Intravenous Canceled Entry 07/02/2020 2245)  iohexol (OMNIPAQUE) 300 MG/ML solution 100 mL (100 mLs Intravenous Contrast Given 06/29/2020 2243)    ED Course  I have reviewed the triage vital signs and the nursing notes.  Pertinent labs & imaging results that were available during my care of the patient were reviewed by me and considered in my medical decision making (see chart for details).    MDM Rules/Calculators/A&P  Patient brought by EMS with head injury and GCS of 3 as described in the HPI.  Immediately upon his arrival patient was made a level 1 trauma due to the altered mental status and fall downstairs.  He arrived unresponsive being ventilated by bag valve mask.  Patient placed on the cardiac monitor with external pacing in place.  The pacer was turned off and patient was found to be in atrial fibrillation with rapid ventricular response.  His blood pressures have now normalized.  As patient  had altered mental status and was not protecting airway, we proceeded with RSI.  Patient given 20 mg of etomidate along with 150 mg of succinylcholine.  With some manipulation, the cords were eventually visualized and a 7.5 endotracheal tube was placed using the glide scope.  Tube placement confirmed with direct visualization, end-tidal CO2, and auscultation over the chest and lungs.  Patient then went to radiology for imaging studies.  Unfortunately this revealed subdural hematoma along with intraparenchymal hemorrhage with significant midline shift.  Patient has been seen by Dr. Donne Hazel from trauma surgery as well as neurosurgery.  It is my understanding patient will go to the operating room for evacuation of his subdural hematoma.   CRITICAL CARE Performed by: Veryl Speak Total critical care time: 45 minutes Critical care time was exclusive of separately billable procedures and treating other patients. Critical care was necessary to treat or prevent imminent or life-threatening deterioration. Critical care was time spent personally by me on the following activities: development of treatment plan with patient and/or surrogate as well as nursing, discussions with consultants, evaluation of patient's response to treatment, examination of patient, obtaining history from patient or surrogate, ordering and performing treatments and interventions, ordering and review of laboratory studies, ordering and review of radiographic studies, pulse oximetry and re-evaluation of patient's condition.   Final Clinical Impression(s) / ED Diagnoses Final diagnoses:  Trauma    Rx / DC Orders ED Discharge Orders    None       Veryl Speak, MD 06/08/2020 2339

## 2020-06-30 NOTE — Anesthesia Preprocedure Evaluation (Signed)
Anesthesia Evaluation  Patient identified by MRN, date of birth, ID band Patient awake    Reviewed: Allergy & Precautions, NPO status , Patient's Chart, lab work & pertinent test results  Airway Mallampati: Intubated       Dental   Pulmonary former smoker,    Pulmonary exam normal        Cardiovascular hypertension, Pt. on medications Normal cardiovascular exam     Neuro/Psych    GI/Hepatic   Endo/Other  diabetes, Type 2, Oral Hypoglycemic Agents  Renal/GU      Musculoskeletal   Abdominal   Peds  Hematology   Anesthesia Other Findings   Reproductive/Obstetrics                             Anesthesia Physical Anesthesia Plan  ASA: III and emergent  Anesthesia Plan: General   Post-op Pain Management:    Induction: Intravenous  PONV Risk Score and Plan: Ondansetron and Treatment may vary due to age or medical condition  Airway Management Planned: Oral ETT  Additional Equipment: Arterial line, CVP and Ultrasound Guidance Line Placement  Intra-op Plan:   Post-operative Plan: Post-operative intubation/ventilation  Informed Consent: I have reviewed the patients History and Physical, chart, labs and discussed the procedure including the risks, benefits and alternatives for the proposed anesthesia with the patient or authorized representative who has indicated his/her understanding and acceptance.       Plan Discussed with: CRNA and Surgeon  Anesthesia Plan Comments:         Anesthesia Quick Evaluation

## 2020-06-30 NOTE — H&P (Signed)
Ryan Rosales is an 69 y.o. male.   Chief Complaint: fall HPI: 82 yom who had fall tonight on concrete, unresponsive.  gcs is three upon arrival  Past Medical History:  Diagnosis Date  . Diabetes mellitus without complication ([place])   . Hyperlipidemia   . Hypertension     Past Surgical History:  Procedure Laterality Date  . ROTATOR CUFF REPAIR      Family History  Problem Relation Age of Onset  . Diabetes Mother    Social History:  reports that he quit smoking about 57 years ago. His smoking use included cigarettes. He has never used smokeless tobacco. He reports current alcohol use. He reports previous drug use.  Allergies:  Allergies  Allergen Reactions  . Lisinopril Swelling and Other (See Comments)    Lips became swollen  . Crestor [Rosuvastatin Calcium]     Agitated and irritable easily  . Hydrochlorothiazide Nausea Only   meds reviewed from chart  Results for orders placed or performed during the hospital encounter of 06/12/2020 (from the past 48 hour(s))  CBC     Status: Abnormal   Collection Time: 06/06/2020 10:32 PM  Result Value Ref Range   WBC 8.4 4.0 - 10.5 K/uL   RBC 3.70 (L) 4.22 - 5.81 MIL/uL   Hemoglobin 11.4 (L) 13.0 - 17.0 g/dL   HCT 36.4 (L) 39 - 52 %   MCV 98.4 80.0 - 100.0 fL   MCH 30.8 26.0 - 34.0 pg   MCHC 31.3 30.0 - 36.0 g/dL   RDW 12.7 11.5 - 15.5 %   Platelets 198 150 - 400 K/uL   nRBC 0.0 0.0 - 0.2 %    Comment: Performed at [facility], [place] [address] [place], [place] [address]  Protime-INR     Status: Abnormal   Collection Time: 06/02/2020 10:32 PM  Result Value Ref Range   Prothrombin Time 15.7 (H) 11.4 - 15.2 seconds   INR 1.3 (H) 0.8 - 1.2    Comment: (NOTE) INR goal varies based on device and disease states. Performed at [facility], [place] [address], [place], [place] [address]   Sample to Blood Bank     Status: None   Collection Time: 06/12/2020 10:36 PM  Result Value Ref Range   Blood Bank Specimen SAMPLE AVAILABLE  FOR TESTING    Sample Expiration      2020/07/09,2359 Performed at [facility], [place] [address] [place], [place] [address]    DG Pelvis Portable  Result Date: 06/09/2020 CLINICAL DATA:  Fall, unresponsive EXAM: PORTABLE PELVIS 1-2 VIEWS COMPARISON:  None. FINDINGS: There is no evidence of pelvic fracture or diastasis. No pelvic bone lesions are seen. IMPRESSION: Negative. Electronically Signed   By: Fidela Salisbury MD   On: 06/16/2020 22:48   DG Chest Port 1 View  Result Date: 06/16/2020 CLINICAL DATA:  Level 1 trauma, fall, unresponsive EXAM: PORTABLE CHEST 1 VIEW COMPARISON:  None. FINDINGS: Endotracheal tube is seen with its tip within the right mainstem bronchus. Mild left basilar atelectasis. No pneumothorax or pleural effusion. Cardiac size within normal limits. Pulmonary vascularity is normal. No acute bone abnormality. IMPRESSION: Right mainstem intubation. Withdrawal of the a endotracheal tube by at least 3.5 cm would better position the tube. Attempts are being made to contact the managing physician at this time. Electronically Signed   By: Fidela Salisbury MD   On: 06/13/2020 22:47    Review of Systems  Unable to perform ROS: Patient  nonverbal    Blood pressure (!) 156/87, pulse (!) 54, temperature (!) 97.3 F (36.3 C), resp. rate 19, height 5\' 8"  (1.727 m), weight 90.7 kg, SpO2 100 %. Physical Exam Constitutional:      General: He is not in acute distress.    Appearance: He is ill-appearing.  HENT:     Head:     Comments: Scalp hematomas     Right Ear: External ear normal.     Left Ear: External ear normal.     Nose:     Comments: Blood via nose Eyes:     Comments: Pupils fixed  Cardiovascular:     Rate and Rhythm: Regular rhythm. Bradycardia present.     Pulses: Normal pulses.  Pulmonary:     Comments: Coarse breath sounds bilaterally Abdominal:     General: There is no distension.     Palpations: Abdomen is soft. There is no mass.     Hernia: No hernia  is present.  Lymphadenopathy:     Cervical: No cervical adenopathy.  Skin:    General: Skin is warm and dry.     Capillary Refill: Capillary refill takes less than 2 seconds.  Neurological:     Comments: gcs 3 Unable to perform any more of exam due to loc  Psychiatric:        Mood and Affect: Mood normal.        Behavior: Behavior normal.      Assessment/Plan Fall SAH/SDH- urgent nsurg consult for decompression Anterior med hematoma- no great vessel injury, likely related to clavicle dislocation, will discuss with thoracic surgery Left clavicle dislocation Check teg now scds icu admission  I spent 50 minutes cc time   Rolm Bookbinder, MD 06/04/2020, 11:04 PM

## 2020-07-01 ENCOUNTER — Inpatient Hospital Stay (HOSPITAL_COMMUNITY): Payer: Medicare Other

## 2020-07-01 DIAGNOSIS — S069XAA Unspecified intracranial injury with loss of consciousness status unknown, initial encounter: Secondary | ICD-10-CM | POA: Diagnosis present

## 2020-07-01 LAB — POCT I-STAT 7, (LYTES, BLD GAS, ICA,H+H)
Acid-base deficit: 9 mmol/L — ABNORMAL HIGH (ref 0.0–2.0)
Acid-base deficit: 11 mmol/L — ABNORMAL HIGH (ref 0.0–2.0)
Acid-base deficit: 5 mmol/L — ABNORMAL HIGH (ref 0.0–2.0)
Bicarbonate: 17.9 mmol/L — ABNORMAL LOW (ref 20.0–28.0)
Bicarbonate: 15.8 mmol/L — ABNORMAL LOW (ref 20.0–28.0)
Bicarbonate: 21.7 mmol/L (ref 20.0–28.0)
Calcium, Ion: 0.87 mmol/L — CL (ref 1.15–1.40)
Calcium, Ion: 0.73 mmol/L — CL (ref 1.15–1.40)
Calcium, Ion: 0.99 mmol/L — ABNORMAL LOW (ref 1.15–1.40)
HCT: 17 % — ABNORMAL LOW (ref 39.0–52.0)
HCT: 25 % — ABNORMAL LOW (ref 39.0–52.0)
HCT: 25 % — ABNORMAL LOW (ref 39.0–52.0)
Hemoglobin: 5.8 g/dL — CL (ref 13.0–17.0)
Hemoglobin: 8.5 g/dL — ABNORMAL LOW (ref 13.0–17.0)
Hemoglobin: 8.5 g/dL — ABNORMAL LOW (ref 13.0–17.0)
O2 Saturation: 100 %
O2 Saturation: 100 %
O2 Saturation: 100 %
Patient temperature: 98.6
Patient temperature: 35
Patient temperature: 35
Potassium: 3.1 mmol/L — ABNORMAL LOW (ref 3.5–5.1)
Potassium: 2.9 mmol/L — ABNORMAL LOW (ref 3.5–5.1)
Potassium: 3.4 mmol/L — ABNORMAL LOW (ref 3.5–5.1)
Sodium: 149 mmol/L — ABNORMAL HIGH (ref 135–145)
Sodium: 141 mmol/L (ref 135–145)
Sodium: 147 mmol/L — ABNORMAL HIGH (ref 135–145)
TCO2: 19 mmol/L — ABNORMAL LOW (ref 22–32)
TCO2: 17 mmol/L — ABNORMAL LOW (ref 22–32)
TCO2: 23 mmol/L (ref 22–32)
pCO2 arterial: 42.5 mmHg (ref 32.0–48.0)
pCO2 arterial: 35.2 mmHg (ref 32.0–48.0)
pCO2 arterial: 42.1 mmHg (ref 32.0–48.0)
pH, Arterial: 7.232 — ABNORMAL LOW (ref 7.350–7.450)
pH, Arterial: 7.248 — ABNORMAL LOW (ref 7.350–7.450)
pH, Arterial: 7.309 — ABNORMAL LOW (ref 7.350–7.450)
pO2, Arterial: 513 mmHg — ABNORMAL HIGH (ref 83.0–108.0)
pO2, Arterial: 240 mmHg — ABNORMAL HIGH (ref 83.0–108.0)
pO2, Arterial: 430 mmHg — ABNORMAL HIGH (ref 83.0–108.0)

## 2020-07-01 LAB — BASIC METABOLIC PANEL
Anion gap: 14 (ref 5–15)
BUN: 10 mg/dL (ref 8–23)
CO2: 15 mmol/L — ABNORMAL LOW (ref 22–32)
Calcium: 5.3 mg/dL — CL (ref 8.9–10.3)
Chloride: 118 mmol/L — ABNORMAL HIGH (ref 98–111)
Creatinine, Ser: 0.68 mg/dL (ref 0.61–1.24)
GFR, Estimated: >60 mL/min (ref >60)
Glucose, Bld: 192 mg/dL — ABNORMAL HIGH (ref 70–99)
Potassium: 3.2 mmol/L — ABNORMAL LOW (ref 3.5–5.1)
Sodium: 147 mmol/L — ABNORMAL HIGH (ref 135–145)

## 2020-07-01 LAB — CBC
HCT: 21.7 % — ABNORMAL LOW (ref 39.0–52.0)
Hemoglobin: 7 g/dL — ABNORMAL LOW (ref 13.0–17.0)
MCH: 31 pg (ref 26.0–34.0)
MCHC: 32.3 g/dL (ref 30.0–36.0)
MCV: 96 fL (ref 80.0–100.0)
Platelets: 43 10*3/uL — ABNORMAL LOW (ref 150–400)
RBC: 2.26 MIL/uL — ABNORMAL LOW (ref 4.22–5.81)
RDW: 14.1 % (ref 11.5–15.5)
WBC: 9.4 10*3/uL (ref 4.0–10.5)
nRBC: 0 % (ref 0.0–0.2)

## 2020-07-01 LAB — TRAUMA TEG PANEL
CFF Max Amplitude: 7.3 mm — ABNORMAL LOW (ref 15–32)
Citrated Kaolin (R): 8.6 min (ref 4.6–9.1)
Citrated Kaolin (R): 9 min (ref 4.6–9.1)
Citrated Rapid TEG (MA): 49.2 mm — ABNORMAL LOW (ref 52–70)
Citrated Rapid TEG (MA): <40 mm — ABNORMAL LOW (ref 52–70)
Lysis at 30 Minutes: 0 % (ref 0.0–2.6)
Lysis at 30 Minutes: 0 % (ref 0.0–2.6)

## 2020-07-01 LAB — PROTIME-INR
INR: 2.9 — ABNORMAL HIGH (ref 0.8–1.2)
INR: 3 — ABNORMAL HIGH (ref 0.8–1.2)
Prothrombin Time: 29 seconds — ABNORMAL HIGH (ref 11.4–15.2)
Prothrombin Time: 29.8 seconds — ABNORMAL HIGH (ref 11.4–15.2)

## 2020-07-01 LAB — HEMOGLOBIN A1C
Hgb A1c MFr Bld: 5.2 % (ref 4.8–5.6)
Mean Plasma Glucose: 102.54 mg/dL

## 2020-07-01 LAB — HEMOGLOBIN AND HEMATOCRIT, BLOOD
HCT: 27 % — ABNORMAL LOW (ref 39.0–52.0)
Hemoglobin: 9 g/dL — ABNORMAL LOW (ref 13.0–17.0)

## 2020-07-01 LAB — PREPARE RBC (CROSSMATCH)

## 2020-07-01 LAB — TRIGLYCERIDES: Triglycerides: 74 mg/dL (ref <150)

## 2020-07-01 LAB — PLATELET COUNT: Platelets: 53 10*3/uL — ABNORMAL LOW (ref 150–400)

## 2020-07-01 LAB — APTT: aPTT: >200 seconds (ref 24–36)

## 2020-07-01 MED ORDER — HYDROGEN PEROXIDE 3 % EX SOLN
CUTANEOUS | Status: DC | PRN
  Administered 2020-07-01: 1

## 2020-07-01 MED ORDER — SODIUM CHLORIDE 0.9% IV SOLUTION: Freq: Once | INTRAVENOUS | Status: DC

## 2020-07-01 MED ORDER — NOREPINEPHRINE 4 MG/250ML-% IV SOLN
0.0000 ug/min | INTRAVENOUS | Status: DC
  Administered 2020-07-01: 2 ug/min via INTRAVENOUS

## 2020-07-01 MED ORDER — SODIUM CHLORIDE 0.9 % IV BOLUS: 1000.0000 mL | Freq: Once | INTRAVENOUS | Status: DC

## 2020-07-01 MED ORDER — FENTANYL CITRATE (PF) 250 MCG/5ML IJ SOLN
INTRAMUSCULAR | Status: DC | PRN
  Administered 2020-06-30: 50 ug via INTRAVENOUS
  Administered 2020-07-01 (×3): 100 ug via INTRAVENOUS
  Administered 2020-07-01 (×3): 50 ug via INTRAVENOUS

## 2020-07-01 MED ORDER — THROMBIN 5000 UNITS EX SOLR
CUTANEOUS | Status: AC
  Filled 2020-07-01: qty 10000

## 2020-07-01 MED ORDER — DOCUSATE SODIUM 50 MG/5ML PO LIQD: 100.0000 mg | Freq: Two times a day (BID) | ORAL | Status: DC

## 2020-07-01 MED ORDER — SODIUM CHLORIDE 0.9 % IV SOLN: INTRAVENOUS | Status: DC | PRN

## 2020-07-01 MED ORDER — POLYETHYLENE GLYCOL 3350 17 G PO PACK: 17.0000 g | PACK | Freq: Every day | ORAL | Status: DC

## 2020-07-01 MED ORDER — ESMOLOL HCL 100 MG/10ML IV SOLN
INTRAVENOUS | Status: AC
  Filled 2020-07-01: qty 20

## 2020-07-01 MED ORDER — MANNITOL 25 % IV SOLN
INTRAVENOUS | Status: DC | PRN
  Administered 2020-07-01 (×2): 12.5 g via INTRAVENOUS

## 2020-07-01 MED ORDER — SODIUM CHLORIDE 0.9 % IV SOLN: INTRAVENOUS | Status: DC

## 2020-07-01 MED ORDER — ORAL CARE MOUTH RINSE: 15.0000 mL | OROMUCOSAL | Status: DC

## 2020-07-01 MED ORDER — VASOPRESSIN 20 UNIT/ML IV SOLN
INTRAVENOUS | Status: AC
  Filled 2020-07-01: qty 1

## 2020-07-01 MED ORDER — LIDOCAINE-EPINEPHRINE 1 %-1:100000 IJ SOLN
INTRAMUSCULAR | Status: DC | PRN
  Administered 2020-07-01: 10 mL

## 2020-07-01 MED ORDER — NOREPINEPHRINE 4 MG/250ML-% IV SOLN
INTRAVENOUS | Status: AC
  Filled 2020-07-01: qty 250

## 2020-07-01 MED ORDER — 0.9 % SODIUM CHLORIDE (POUR BTL) OPTIME
TOPICAL | Status: DC | PRN
  Administered 2020-07-01 (×2): 1000 mL

## 2020-07-01 MED ORDER — LEVETIRACETAM IN NACL 500 MG/100ML IV SOLN: 500.0000 mg | Freq: Two times a day (BID) | INTRAVENOUS | Status: DC

## 2020-07-01 MED ORDER — ACETAMINOPHEN 325 MG PO TABS: 650.0000 mg | ORAL_TABLET | Freq: Four times a day (QID) | ORAL | Status: DC | PRN

## 2020-07-01 MED ORDER — PHENYLEPHRINE 40 MCG/ML (10ML) SYRINGE FOR IV PUSH (FOR BLOOD PRESSURE SUPPORT)
PREFILLED_SYRINGE | INTRAVENOUS | Status: DC | PRN
  Administered 2020-07-01: 80 ug via INTRAVENOUS
  Administered 2020-07-01 (×2): 120 ug via INTRAVENOUS
  Administered 2020-07-01: 80 ug via INTRAVENOUS

## 2020-07-01 MED ORDER — ALBUMIN HUMAN 5 % IV SOLN: INTRAVENOUS | Status: DC | PRN

## 2020-07-01 MED ORDER — PHENYLEPHRINE HCL-NACL 10-0.9 MG/250ML-% IV SOLN
INTRAVENOUS | Status: AC
  Filled 2020-07-01: qty 250

## 2020-07-01 MED ORDER — THROMBIN 5000 UNITS EX SOLR
OROMUCOSAL | Status: DC | PRN
  Administered 2020-07-01 (×3): 5 mL via TOPICAL

## 2020-07-01 MED ORDER — ONDANSETRON 4 MG PO TBDP: 4.0000 mg | ORAL_TABLET | Freq: Four times a day (QID) | ORAL | Status: DC | PRN

## 2020-07-01 MED ORDER — PHENYLEPHRINE HCL-NACL 10-0.9 MG/250ML-% IV SOLN
INTRAVENOUS | Status: DC | PRN
  Administered 2020-06-30: 25 ug/min via INTRAVENOUS

## 2020-07-01 MED ORDER — VASOPRESSIN 20 UNIT/ML IV SOLN
INTRAVENOUS | Status: DC | PRN
  Administered 2020-06-30: 1 [IU] via INTRAVENOUS
  Administered 2020-07-01 (×3): 5 [IU] via INTRAVENOUS
  Administered 2020-07-01: 2 [IU] via INTRAVENOUS
  Administered 2020-07-01 (×2): 5 [IU] via INTRAVENOUS
  Administered 2020-07-01: 2 [IU] via INTRAVENOUS
  Administered 2020-07-01 (×4): 5 [IU] via INTRAVENOUS

## 2020-07-01 MED ORDER — FENTANYL CITRATE (PF) 100 MCG/2ML IJ SOLN: 25.0000 ug | INTRAMUSCULAR | Status: DC | PRN

## 2020-07-01 MED ORDER — CHLORHEXIDINE GLUCONATE 0.12% ORAL RINSE (MEDLINE KIT): 15.0000 mL | Freq: Two times a day (BID) | OROMUCOSAL | Status: DC

## 2020-07-01 MED ORDER — SODIUM BICARBONATE 8.4 % IV SOLN
INTRAVENOUS | Status: DC | PRN
  Administered 2020-07-01 (×3): 50 meq via INTRAVENOUS

## 2020-07-01 MED ORDER — GLYCOPYRROLATE 0.2 MG/ML IJ SOLN: 0.2000 mg | INTRAMUSCULAR | Status: DC | PRN

## 2020-07-01 MED ORDER — CEFAZOLIN SODIUM-DEXTROSE 2-3 GM-%(50ML) IV SOLR
INTRAVENOUS | Status: DC | PRN
  Administered 2020-07-01: 2 g via INTRAVENOUS

## 2020-07-01 MED ORDER — ROCURONIUM BROMIDE 10 MG/ML (PF) SYRINGE
PREFILLED_SYRINGE | INTRAVENOUS | Status: DC | PRN
  Administered 2020-06-30: 80 mg via INTRAVENOUS
  Administered 2020-07-01: 20 mg via INTRAVENOUS
  Administered 2020-07-01: 100 mg via INTRAVENOUS

## 2020-07-01 MED ORDER — ESMOLOL HCL 100 MG/10ML IV SOLN
INTRAVENOUS | Status: DC | PRN
  Administered 2020-06-30: 30 mg via INTRAVENOUS

## 2020-07-01 MED ORDER — VASOPRESSIN 20 UNITS/100 ML INFUSION FOR SHOCK
0.0000 [IU]/min | INTRAVENOUS | Status: DC
  Administered 2020-07-01: 0.03 [IU]/min via INTRAVENOUS
  Filled 2020-07-01: qty 100

## 2020-07-01 MED ORDER — ACETAMINOPHEN 650 MG RE SUPP: 650.0000 mg | Freq: Four times a day (QID) | RECTAL | Status: DC | PRN

## 2020-07-01 MED ORDER — EPHEDRINE SULFATE-NACL 50-0.9 MG/10ML-% IV SOSY
PREFILLED_SYRINGE | INTRAVENOUS | Status: DC | PRN
  Administered 2020-07-01: 10 mg via INTRAVENOUS
  Administered 2020-07-01: 20 mg via INTRAVENOUS

## 2020-07-01 MED ORDER — PROPOFOL 500 MG/50ML IV EMUL
INTRAVENOUS | Status: DC | PRN
  Administered 2020-07-01: 75 ug/kg/min via INTRAVENOUS

## 2020-07-01 MED ORDER — INSULIN ASPART 100 UNIT/ML ~~LOC~~ SOLN: 0.0000 [IU] | Freq: Three times a day (TID) | SUBCUTANEOUS | Status: DC

## 2020-07-01 MED ORDER — DEXTROSE 5 % IV SOLN: INTRAVENOUS | Status: DC

## 2020-07-01 MED ORDER — ONDANSETRON HCL 4 MG/2ML IJ SOLN: 4.0000 mg | Freq: Four times a day (QID) | INTRAMUSCULAR | Status: DC | PRN

## 2020-07-01 MED ORDER — GLYCOPYRROLATE 1 MG PO TABS
1.0000 mg | ORAL_TABLET | ORAL | Status: DC | PRN
  Filled 2020-07-01: qty 1

## 2020-07-01 MED ORDER — PROPOFOL 1000 MG/100ML IV EMUL: 0.0000 ug/kg/min | INTRAVENOUS | Status: DC

## 2020-07-01 MED ORDER — DIPHENHYDRAMINE HCL 50 MG/ML IJ SOLN: 25.0000 mg | INTRAMUSCULAR | Status: DC | PRN

## 2020-07-01 MED ORDER — THROMBIN 20000 UNITS EX SOLR
CUTANEOUS | Status: DC | PRN
  Administered 2020-07-01 (×3): 20 mL via TOPICAL

## 2020-07-01 MED ORDER — THROMBIN 20000 UNITS EX SOLR
CUTANEOUS | Status: AC
  Filled 2020-07-01: qty 40000

## 2020-07-02 LAB — BPAM RBC
Blood Product Expiration Date: 202111232359
Blood Product Expiration Date: 202111292359
Blood Product Expiration Date: 202111292359
Blood Product Expiration Date: 202111292359
Blood Product Expiration Date: 202111292359
Blood Product Expiration Date: 202111292359
Blood Product Expiration Date: 202111292359
Blood Product Expiration Date: 202111292359
Blood Product Expiration Date: 202111292359
Blood Product Expiration Date: 202111292359
Blood Product Expiration Date: 202111292359
Blood Product Expiration Date: 202111292359
Blood Product Expiration Date: 202111292359
Blood Product Expiration Date: 202111292359
Blood Product Expiration Date: 202111292359
Blood Product Expiration Date: 202111292359
ISSUE DATE / TIME: 202110300036
ISSUE DATE / TIME: 202110300036
ISSUE DATE / TIME: 202110300036
ISSUE DATE / TIME: 202110300036
ISSUE DATE / TIME: 202110300058
ISSUE DATE / TIME: 202110300058
ISSUE DATE / TIME: 202110300058
ISSUE DATE / TIME: 202110300058
ISSUE DATE / TIME: 202110300226
ISSUE DATE / TIME: 202110300226
ISSUE DATE / TIME: 202110300333
ISSUE DATE / TIME: 202110300333
Unit Type and Rh: 5100
Unit Type and Rh: 5100
Unit Type and Rh: 5100
Unit Type and Rh: 5100
Unit Type and Rh: 5100
Unit Type and Rh: 5100
Unit Type and Rh: 5100
Unit Type and Rh: 5100
Unit Type and Rh: 5100
Unit Type and Rh: 5100
Unit Type and Rh: 5100
Unit Type and Rh: 5100
Unit Type and Rh: 5100
Unit Type and Rh: 5100
Unit Type and Rh: 5100
Unit Type and Rh: 5100

## 2020-07-02 LAB — PREPARE PLATELET PHERESIS
Unit division: 0
Unit division: 0

## 2020-07-02 LAB — TYPE AND SCREEN
ABO/RH(D): O POS
ABO/RH(D): O POS
Antibody Screen: NEGATIVE
Antibody Screen: NEGATIVE
Unit division: 0
Unit division: 0
Unit division: 0
Unit division: 0
Unit division: 0
Unit division: 0
Unit division: 0
Unit division: 0
Unit division: 0
Unit division: 0
Unit division: 0
Unit division: 0
Unit division: 0
Unit division: 0
Unit division: 0
Unit division: 0

## 2020-07-02 LAB — PREPARE FRESH FROZEN PLASMA
Unit division: 0
Unit division: 0
Unit division: 0
Unit division: 0
Unit division: 0

## 2020-07-02 LAB — BPAM PLATELET PHERESIS
Blood Product Expiration Date: 202111012359
Blood Product Expiration Date: 202111012359
ISSUE DATE / TIME: 202110300344
ISSUE DATE / TIME: 202110301424
Unit Type and Rh: 7300
Unit Type and Rh: 7300

## 2020-07-02 LAB — BPAM FFP
Blood Product Expiration Date: 202111042359
Blood Product Expiration Date: 202111042359
Blood Product Expiration Date: 202111042359
Blood Product Expiration Date: 202111042359
Blood Product Expiration Date: 202111122359
Blood Product Expiration Date: 202111122359
ISSUE DATE / TIME: 202110300058
ISSUE DATE / TIME: 202110300058
ISSUE DATE / TIME: 202110300119
ISSUE DATE / TIME: 202110300119
ISSUE DATE / TIME: 202110300226
ISSUE DATE / TIME: 202110300226
Unit Type and Rh: 6200
Unit Type and Rh: 6200
Unit Type and Rh: 6200
Unit Type and Rh: 6200
Unit Type and Rh: 6200
Unit Type and Rh: 6200

## 2020-07-02 LAB — BPAM CRYOPRECIPITATE
Blood Product Expiration Date: 202110300712
ISSUE DATE / TIME: 202110300138
Unit Type and Rh: 5100

## 2020-07-02 LAB — PREPARE CRYOPRECIPITATE: Unit division: 0

## 2020-07-02 LAB — FIBRINOGEN: Fibrinogen: <60 mg/dL — CL (ref 210–475)

## 2020-07-03 MED FILL — Thrombin For Soln 5000 Unit: CUTANEOUS | Qty: 5000 | Status: AC

## 2020-07-03 MED FILL — Thrombin For Soln 20000 Unit: CUTANEOUS | Qty: 1 | Status: AC

## 2020-07-03 NOTE — Procedures (Signed)
Extubation Procedure Note  Patient Details:   Name: Charvis Lightner DOB: 06/16/1951 MRN: [id number]   Airway Documentation:    Vent end date: 07/18/2020 Vent end time: 0440   Evaluation  O2 sats: stable throughout Complications: No apparent complications Patient did tolerate procedure well. Bilateral Breath Sounds: Rhonchi, Diminished   Yes    Angelique Holm 07/18/2020, 4:54 AM

## 2020-07-03 NOTE — Progress Notes (Signed)
This RN attempted to call [facility]) at this time regarding patient's transition to comfort care. RN was unable to call [facility] prior to this time due to patient being hemodynamically unstable since coming up from OR. This RN will try to call again to let [employer] know.

## 2020-07-03 NOTE — Anesthesia Postprocedure Evaluation (Signed)
Anesthesia Post Note  Patient: Ryan Rosales  Procedure(s) Performed: CRANIOTOMY HEMATOMA EVACUATION SUBDURAL (Right )     Patient location during evaluation: SICU Anesthesia Type: General Level of consciousness: sedated Pain management: pain level controlled Vital Signs Assessment: post-procedure vital signs reviewed and stable Respiratory status: patient remains intubated per anesthesia plan Cardiovascular status: stable Postop Assessment: no apparent nausea or vomiting Anesthetic complications: no   No complications documented.  Last Vitals:  Vitals:   06/08/2020 2315 06/02/2020 2330  BP: (!) 151/63 (!) 150/81  Pulse: (!) 48 (!) 46  Resp: 16 19  Temp:    SpO2: 100% 100%    Last Pain: There were no vitals filed for this visit.               [place]

## 2020-07-03 NOTE — Op Note (Signed)
Preoperative diagnosis: Right-sided subdural hematoma  Postoperative diagnosis: Same  Procedure: Pterional craniotomy for evacuation of right-sided subdural hematoma with partial right frontal lobectomy and temporal lobectomy for pressure control.  With implantation of the bone flap in the abdominal wall on the right  Surgeon: Dominica Severin Shylin Keizer   Assistant: Nash Shearer  Anesthesia: General   EBL: Probably close to a liter  HPI 69 year old found down at home with minimal neurologic exam on arrival had a weak gag took patient to the OR recommended emergent right-sided craniotomy extensively over the risks and benefits of the procedure with the patient's wife as well as perioperative course expectations of outcome and alternatives of surgery and she understood and agreed to proceed forward.  Operative procedure: Patient was brought into the OR was due to general anesthesia positioned supine shoulder bump under his right shoulder head turned to the left the right side his head was shaved prepped and draped in routine sterile fashion then a pterional incision was made scalp was reflected upon drilling the first parietal bur hole immediately brisk heavy venous or arterial blood came out of that bur hole this was packed we did the other bur holes we turned the flap and opened up the dura at this point we had noticed a fracture line that extended toward the midline could not identify the source of the heavy venous bleeding posteriorly I packed this off then the brain started to swell and herniated I the craniotomy defect so I performed a partial right frontal lobectomy and temporal lobectomy to decompress that side at this point after the decompression brisk venous bleeding was coming out from the fracture line was noted the fracture line was extending all the way medial under the fat flap towards the superior sagittal sinus and I think the fracture line disrupted and treated superior sagittal sinus and that was  the source of the bleeding we saw the bur hole but also during the case.  Tried extensive things to pack the soft wax Gelfoam ultimately I cut a piece of temporalis muscle and we tamponaded the temporalis muscle over the fracture line in the region of the sinus and held in place utilizing a Lorenz plate.  After we maintained as much hemostasis as we could we laid a large piece of Gelfoam overlying the brain and closed the scalp with a combination of interrupted Vicryl's staples and silk suture.  During this my assistant created a pocket in the right side abdominal wall and implanted the bone flap into the abdominal wall.  This was all closed with interrupted Vicryl in a running 4 subcuticular.  Both wounds were dressed patient went to the ICU in stable condition of the end the case on needle count sponge counts were correct.

## 2020-07-03 NOTE — Consult Note (Signed)
Responded to page, initially met with wife in consult A, offered her spiritual/emotional support and prayer. Later, took her to wait outside OR, then to talk w/ dr before surgery--where she received a daunting prognosis for her husband. Left her charging phone in [place] waiting room, preparing to call family. F/U would likely be appriciated, so please call if further chaplain services are desired.   Rev. Eloise Levels Chaplain

## 2020-07-03 NOTE — Addendum Note (Signed)
Addendum  created 07/03/20 0802 by Josephine Igo, CRNA   Order list changed

## 2020-07-03 NOTE — Transfer of Care (Signed)
Immediate Anesthesia Transfer of Care Note  Patient: Ryan Rosales  Procedure(s) Performed: CRANIOTOMY HEMATOMA EVACUATION SUBDURAL (Right )  Patient Location: ICU  Anesthesia Type:General  Level of Consciousness: Patient remains intubated per anesthesia plan  Airway & Oxygen Therapy: Patient remains intubated per anesthesia plan and Patient placed on Ventilator (see vital sign flow sheet for setting)  Post-op Assessment: Report given to RN and Post -op Vital signs reviewed and unstable, Anesthesiologist notified Conrad Itmann MD aware)  Post vital signs: Reviewed and stable  Last Vitals:  Vitals Value Taken Time  BP 120/85 2020/07/06 0304  Temp    Pulse 100 06-Jul-2020 0306  Resp 18 2020-07-06 0306  SpO2 100 % 07/06/20 0306  Vitals shown include unvalidated device data.  Last Pain: There were no vitals filed for this visit.     Report to [facility] in ICU, RT present at bedside, applied to ventilator, VSS stable with propofol, neo, NS boluses, PRBC infusing, Cryo infusing per pump, foley and A-line maintained, Ossey MD at bedside, noted right chest/neck swelling, MD team aware.  Complications: No complications documented.

## 2020-07-03 NOTE — Progress Notes (Signed)
Patient ID: Foye Damron, male   DOB: 1950-09-17, 69 y.o.   MRN: [id number] After returning from OR patient continues to hemorrhage from head in multiple sites.  Continued resuscitation with blood products, I placed a line in groin to give more access, repeat cxr done, abg not unreasonable.  Due to initial gcs, brain injury, findings on ct and at operation I discussed with his wife and sister the situation. They have good grasp of situation and clearly state his wishes were not to continue with this. I think his chance of meaningful survival is nil at this point. They would like to proceed to comfort care and extubated him, turn off bp meds. They understand he will expire.

## 2020-07-03 NOTE — Procedures (Signed)
Central line  Date/Time: 2020/07/22 3:38 AM Performed by: Rolm Bookbinder, MD Authorized by: Rolm Bookbinder, MD   Consent:    Consent obtained:  Emergent situation Pre-procedure details:    Skin preparation:  ChloraPrep Procedure details:    Location:  R femoral   Patient position:  Flat   Procedural supplies:  Triple lumen   Number of attempts:  1   Successful placement: yes   Post-procedure details:    Post-procedure:  Dressing applied and line sutured   Assessment:  Blood return through all ports   Patient tolerance of procedure:  Tolerated well, no immediate complications

## 2020-07-03 NOTE — Anesthesia Procedure Notes (Signed)
Arterial Line Insertion Start/End10/29/2021 11:57 PM, 07-22-2020 12:29 AM Performed by: Jearld Pies, CRNA, CRNA  Emergency situation Patient sedated Left was placed Catheter size: 20 G Hand hygiene performed  and Seldinger technique used Allen's test indicative of satisfactory collateral circulation Procedure performed without using ultrasound guided technique. Following insertion, dressing applied and Biopatch. Post procedure assessment: normal  Patient tolerated the procedure well with no immediate complications.

## 2020-07-03 DEATH — deceased

## 2020-07-05 ENCOUNTER — Encounter (HOSPITAL_COMMUNITY): Payer: Self-pay | Admitting: Neurosurgery

## 2020-08-02 NOTE — Discharge Summary (Signed)
Physician Discharge Summary  Patient ID: Ryan Rosales MRN: [id number] DOB/AGE: 1950/09/16 69 y.o.  Admit date: 06/18/2020 Discharge date: 07/20/2020  Admission Diagnoses: Fall with SDH, GCS of 3  Discharge Diagnoses:  Active Problems:   Subdural hematoma (HCC)   TBI (traumatic brain injury) [facility])   Discharged Condition: deceased  Hospital Course: 62 yom who had fall tonight on concrete, unresponsive.  gcs is three upon arrival. He was intubated and then underwent ct scans that showed sah/sdh and an anterior med hematoma with left clavicle dislocation. Underwent urgent craniotomy and then continued to require resuscitation.  In discussion with his sister and his wife we elected to proceed with comfort care and then he expired  Consults: neurosurgery  Significant Diagnostic Studies: ct scan  Treatments: surgery: craniectomy  Disposition: deceased   Allergies as of 2020-07-20      Reactions   Lisinopril Swelling, Other (See Comments)   Lips became swollen   Crestor [rosuvastatin Calcium]    Agitated and irritable easily   Hydrochlorothiazide Nausea Only      Medication List    ASK your doctor about these medications   ALPRAZolam 1 MG tablet Commonly known as: XANAX As needed for MRI   amLODipine 10 MG tablet Commonly known as: NORVASC Take 10 mg by mouth daily.   B-complex with vitamin C tablet Take 1 tablet by mouth daily.   Bayer Low Dose 81 MG EC tablet Generic drug: aspirin Take 81 mg by mouth daily. Swallow whole.   carvedilol 3.125 MG tablet Commonly known as: COREG Take 3.125 mg by mouth 2 (two) times daily.   EPINEPHrine 0.3 mg/0.3 mL Devi Commonly known as: EpiPen Inject 0.3 mLs (0.3 mg total) into the muscle once.   FISH OIL PO Take 2 capsules by mouth 2 (two) times daily.   losartan 50 MG tablet Commonly known as: COZAAR Take 50 mg by mouth daily.   NON FORMULARY Take 1 tablet by mouth See admin instructions. Focus Factor tablets; Take  1 tablet by mouth once a day   omeprazole 20 MG capsule Commonly known as: PRILOSEC Take 20 mg by mouth daily as needed (for GERD-like symptoms).        Signed: Rolm Bookbinder 07/20/2020, 8:41 AM

## 2021-01-25 ENCOUNTER — Other Ambulatory Visit: Payer: Medicare Other

## 2021-01-25 ENCOUNTER — Ambulatory Visit: Payer: Medicare Other | Admitting: Oncology

## 2021-03-04 IMAGING — US US THYROID
1 series · 12 of 25 positions shown · non-contrast
Comparison: Prior thyroid ultrasound 08/24/2019; 07/07/2017

CLINICAL DATA: Prior ultrasound follow-up. Multinodular thyroid
gland with left-sided thyroid nodules (# 2 and # 3) currently under
surveillance.

EXAM:
THYROID ULTRASOUND
TECHNIQUE: Ultrasound examination of the thyroid gland and adjacent soft
tissues was performed.

[Series 1: us thyroid · 0.07mm/px · 12 of 43 slices shown]
[im 2/43]
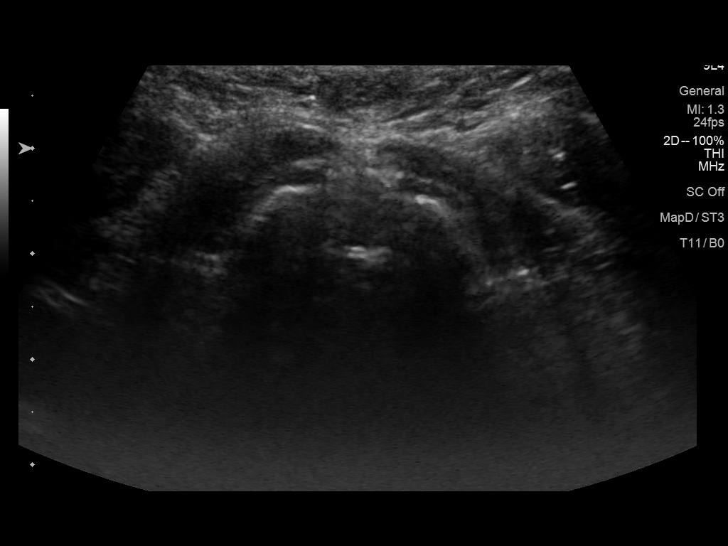
[im 6/43]
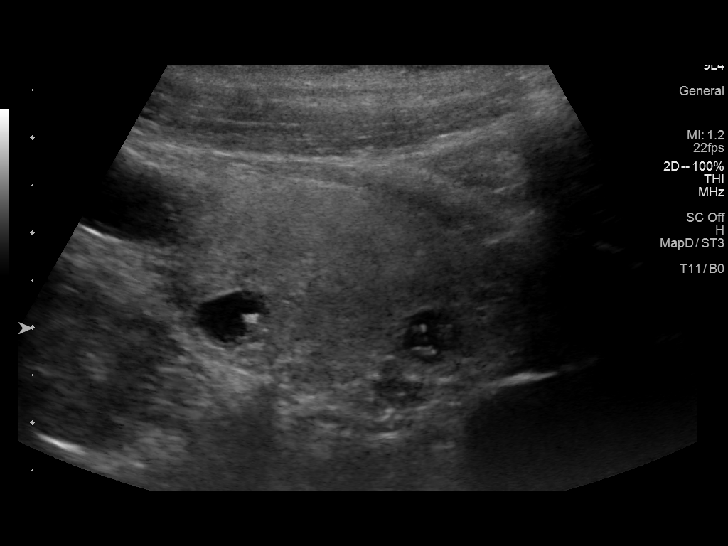
[im 9/43]
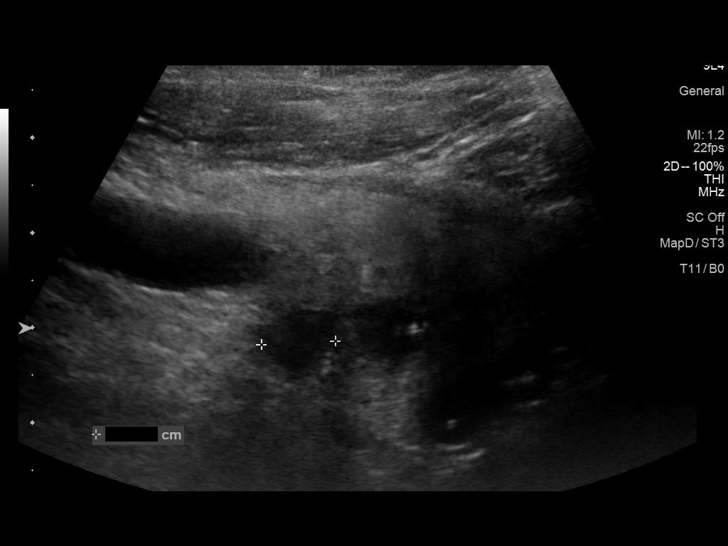
[im 13/43]
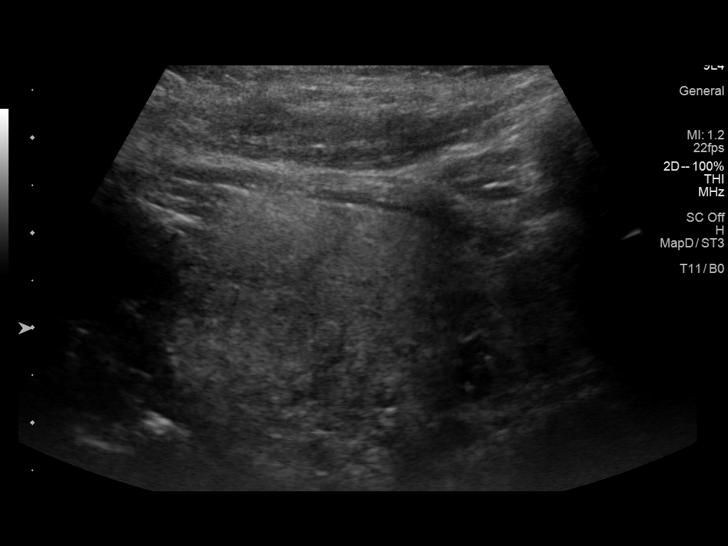
[im 16/43]
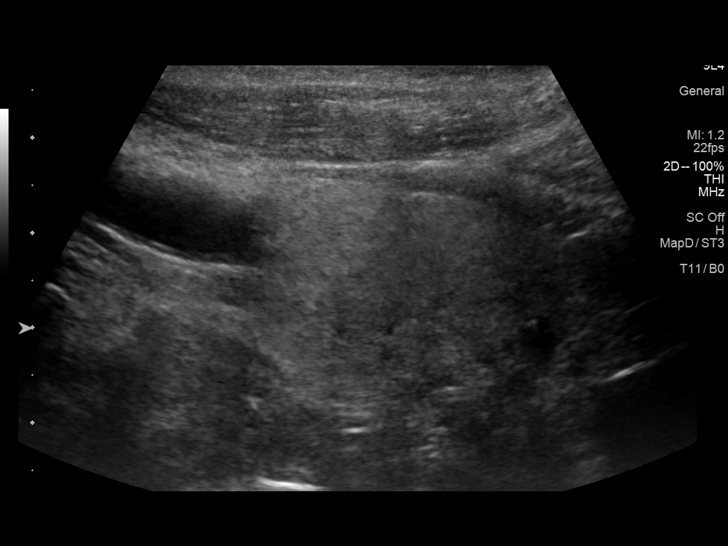
[im 20/43]
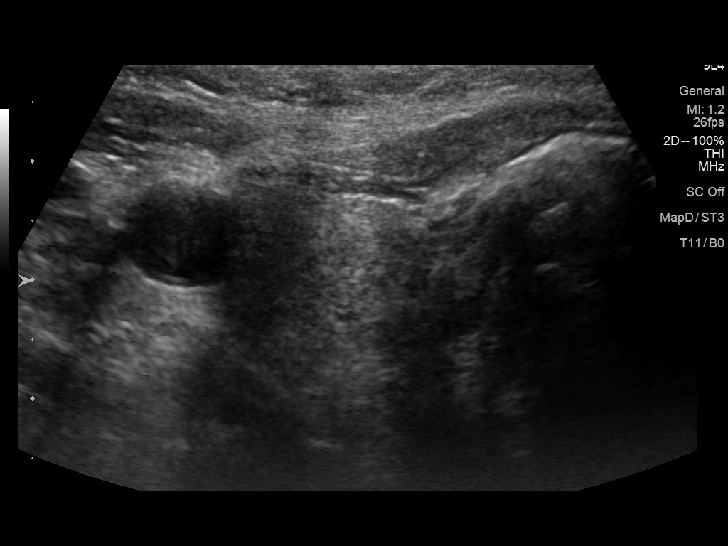
[im 23/43]
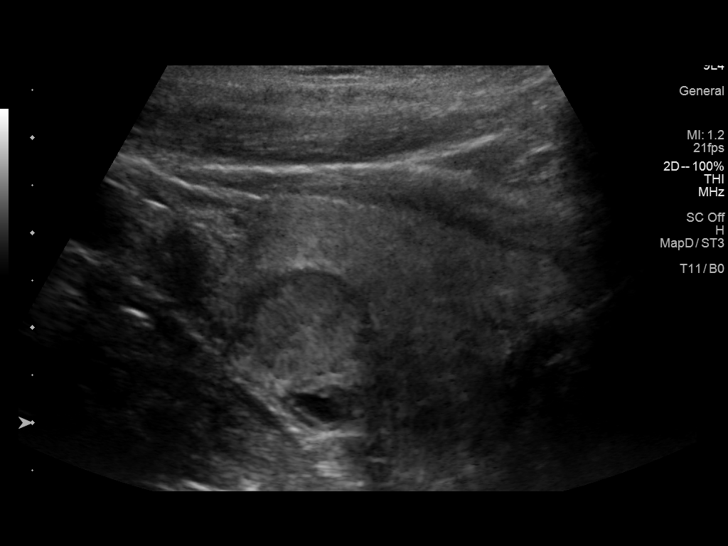
[im 27/43]
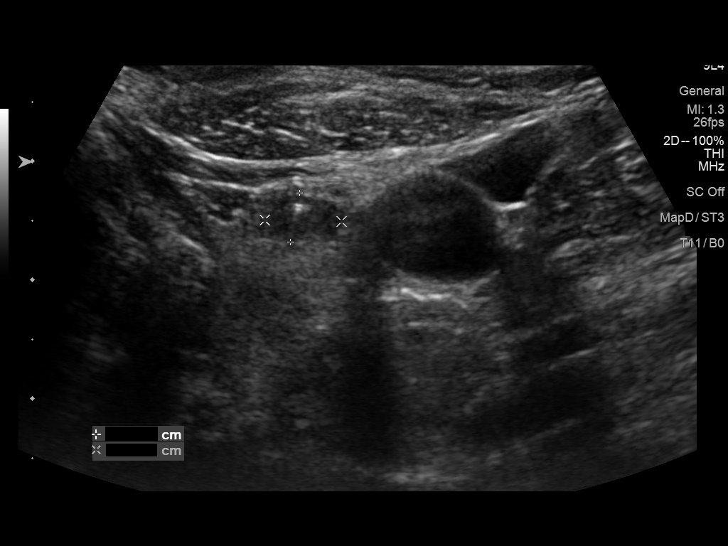
[im 30/43]
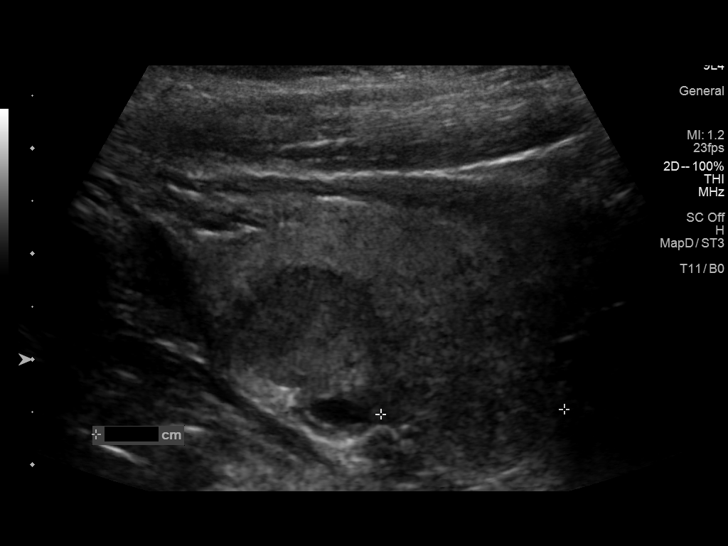
[im 34/43]
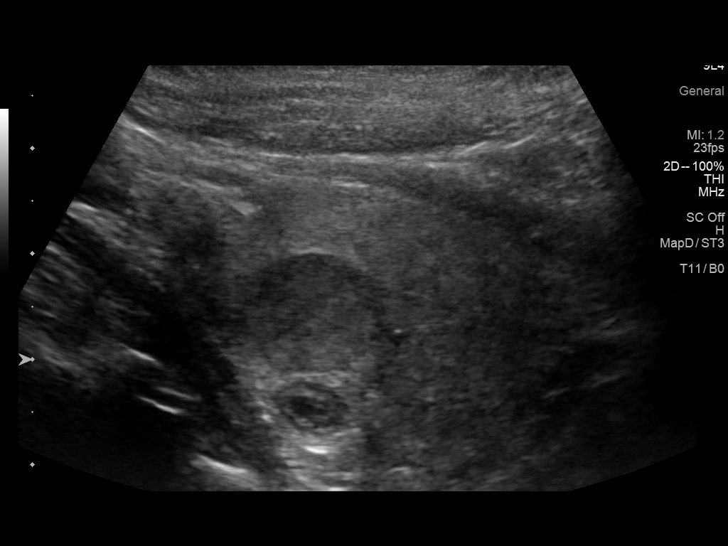
[im 37/43]
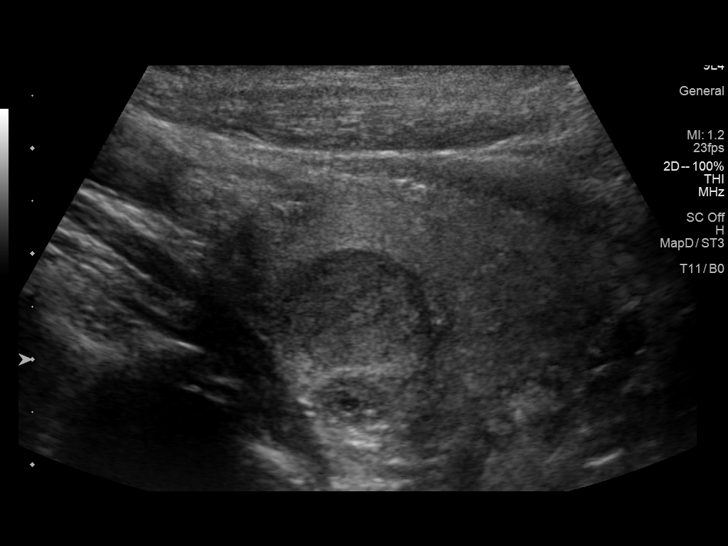
[im 41/43]
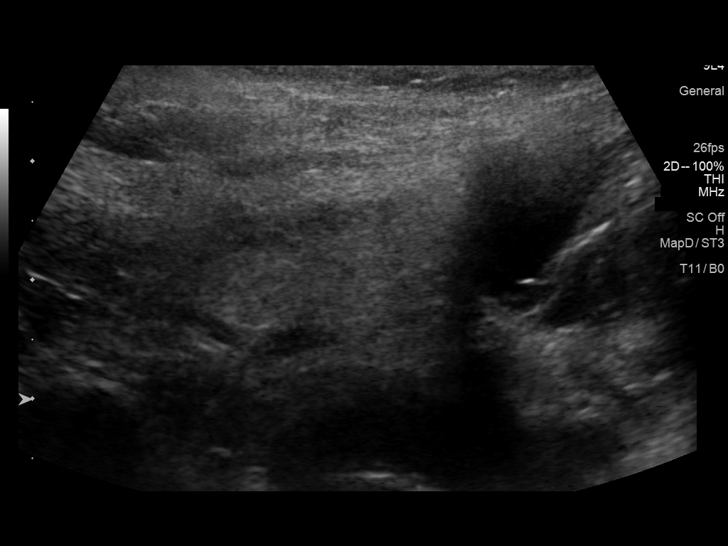

[12 of 25 positions shown; findings below may reference images not displayed]

FINDINGS: Parenchymal Echotexture: Moderately heterogenous

Isthmus: 0.3 cm

Right lobe: 4.4 x 2.3 x 2.2 cm

Left lobe: 5.0 x 2.4 x 1.8 cm

_________________________________________________________

Estimated total number of nodules >/= 1 cm: 3

Number of spongiform nodules >/=  2 cm not described below (TR1): 0

Number of mixed cystic and solid nodules >/= 1.5 cm not described
below (TR2): 0

_________________________________________________________

Nodule # 4 (previously # 2):

Prior biopsy: No

Location: Left; Mid

Maximum size: 1.4 cm; Other 2 dimensions: 1.3 x 1.0 cm, previously,
1.4 x 1.3 x 1.1 cm in August 2019, and 1.1 x 0.9 x 1.1 cm in 3343

Composition: solid/almost completely solid (2)

Echogenicity: hypoechoic (2)

Shape: not taller-than-wide (0)

Margins: ill-defined (0)

Echogenic foci: none (0)

ACR TI-RADS total points: 4.

ACR TI-RADS risk category:  TR4 (4-6 points).

Significant change in size (>/= 20% in two dimensions and minimal
increase of 2 mm): No

Change in features: No

Change in ACR TI-RADS risk category: No

ACR TI-RADS recommendations:

*Given size (>/= 1 - 1.4 cm) and appearance, a follow-up ultrasound
in 1 year should be considered based on TI-RADS criteria.

_________________________________________________________

Nodule # 5 (previously # 3):

Prior biopsy: No

Location: Left; Inferior

Maximum size: 1.5 cm; Other 2 dimensions: 1.1 x 1.2 cm, previously,
1.4 x 1.2 x 0.9 cm

Composition: solid/almost completely solid (2)

Echogenicity: hypoechoic (2)

Shape: not taller-than-wide (0)

Margins: ill-defined (0)

Echogenic foci: none (0)

ACR TI-RADS total points: 4.

ACR TI-RADS risk category:  TR4 (4-6 points).

Significant change in size (>/= 20% in two dimensions and minimal
increase of 2 mm): No

Change in features: No

Change in ACR TI-RADS risk category: No

ACR TI-RADS recommendations:

*Given size (>/= 1 - 1.4 cm) and appearance, a follow-up ultrasound
in 1 year should be considered based on TI-RADS criteria.

_________________________________________________________

No new or suspicious thyroid nodules identified. Additional small
incidentally noted thyroid nodules and benign colloid cysts are
again visualized without interval change. None of these lesions meet
criteria for further evaluation.
IMPRESSION: 1. Minimal change in approximately 1.4 cm TI-RADS category 4 nodule
in the left mid gland (labeled # 4 on the present study) dating back
to Monday July, 2017 consistent with 2.5 years of stability.
Recommend continued annual ultrasound surveillance until 5 year
stability is confirmed.
2. No significant interval change in the size or appearance of the
approximately 1.5 cm TI-RADS category 4 nodule in the left inferior
gland (labeled # 5 on the present study) compared to Saturday August, 2019 confirming 6 months of stability. This nodule was not well seen
on prior studies. Recommend continued annual surveillance until 5
year stability has been confirmed.

The above is in keeping with the ACR TI-RADS recommendations - [HOSPITAL] 0332;[DATE].

## 2021-07-31 IMAGING — CT CT HEAD W/O CM
3 of 4 series · 13 of 47 positions shown, 15 images · non-contrast
Comparison: None.

CLINICAL DATA: Fall, head injury, unresponsive

EXAM:
CT HEAD WITHOUT CONTRAST
CT CERVICAL SPINE WITHOUT CONTRAST
TECHNIQUE: Multidetector CT imaging of the head and cervical spine was
performed following the standard protocol without intravenous
contrast. Multiplanar CT image reconstructions of the cervical spine
were also generated.

[Series 3: head wo · axial · 0.50mm/px · z∈[-162,-17]mm · 7 of 39 slices shown, 9 images]
[im 5/39  brain]
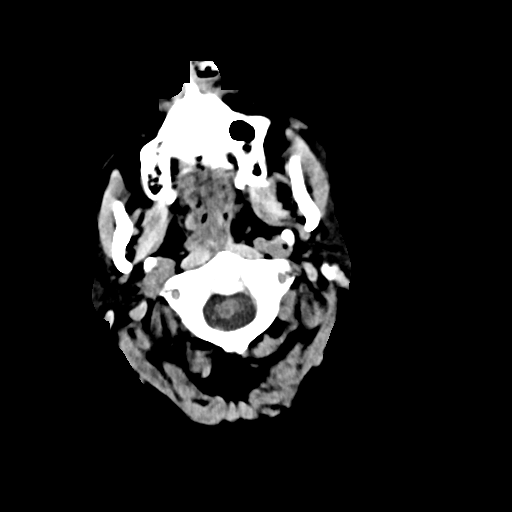
[im 5/39  bone]
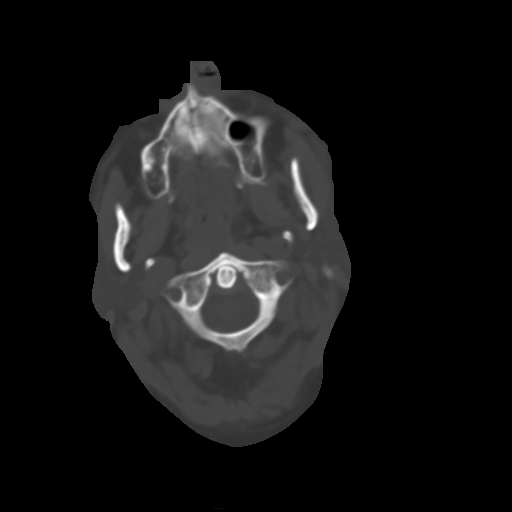
[im 10/39  brain]
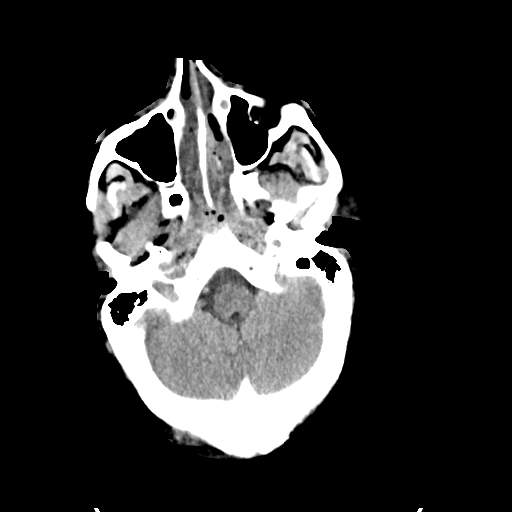
[im 15/39  brain]
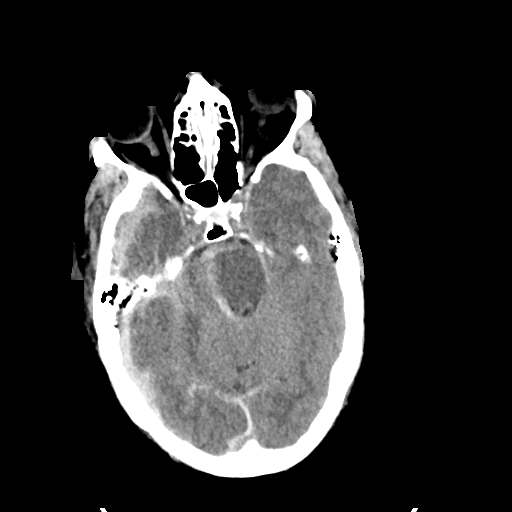
[im 20/39  brain]
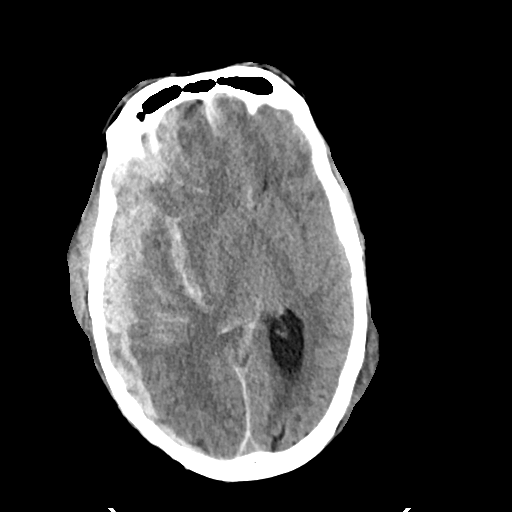
[im 24/39  brain]
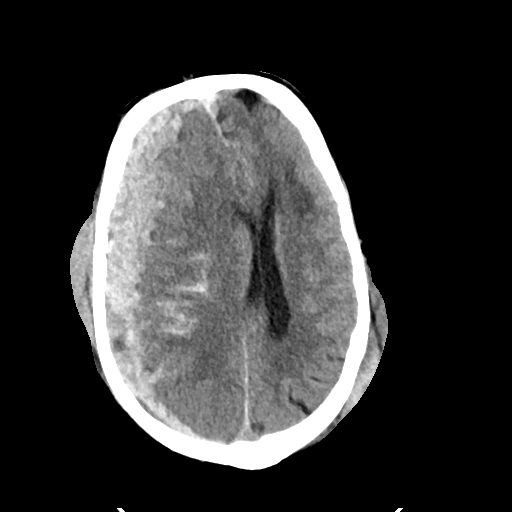
[im 24/39  bone]
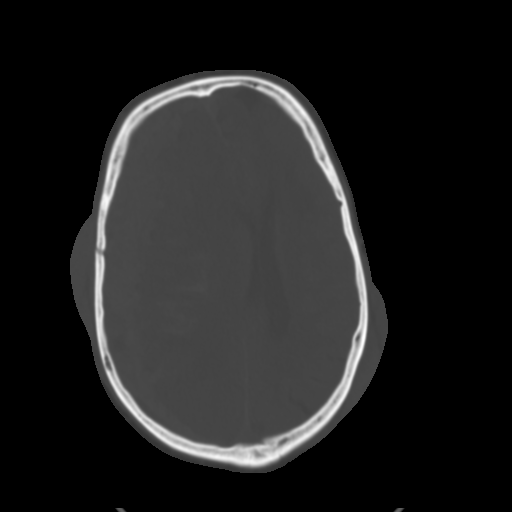
[im 29/39  brain]
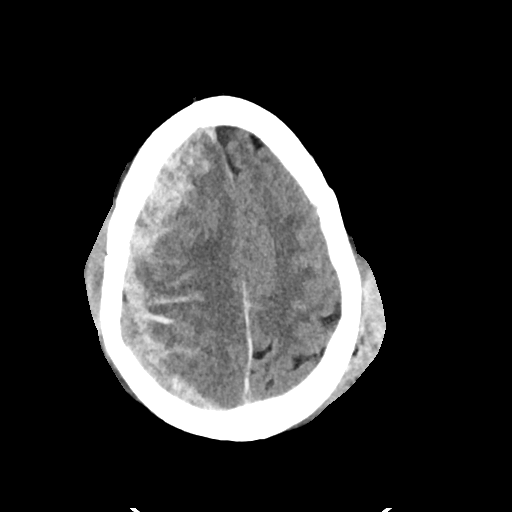
[im 34/39  brain]
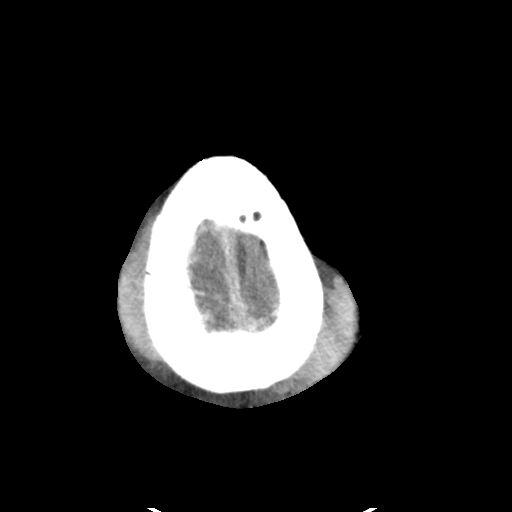

[Series 5: cor soft · coronal · 0.37mm/px · 3 of 93 slices shown]
[im 31/93  brain]
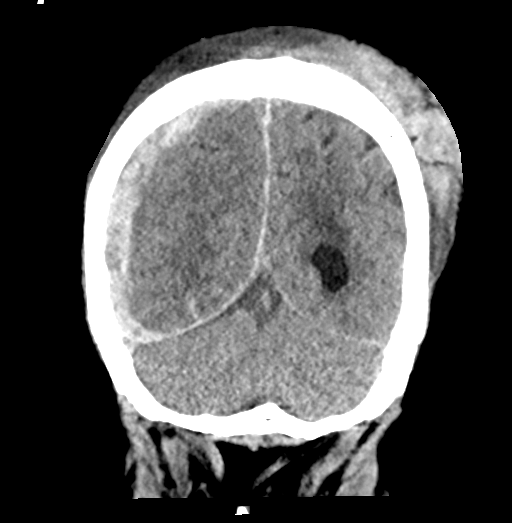
[im 41/93  brain]
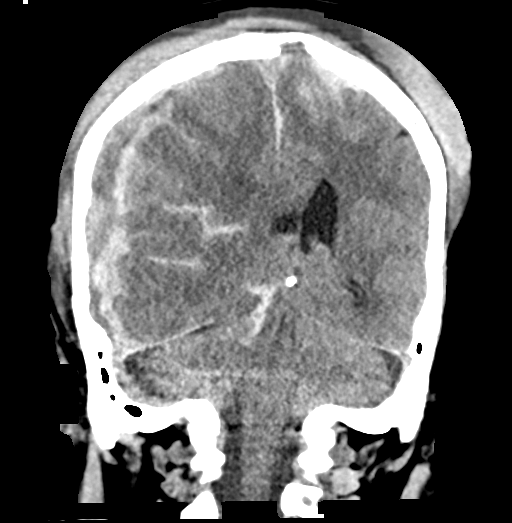
[im 52/93  brain]
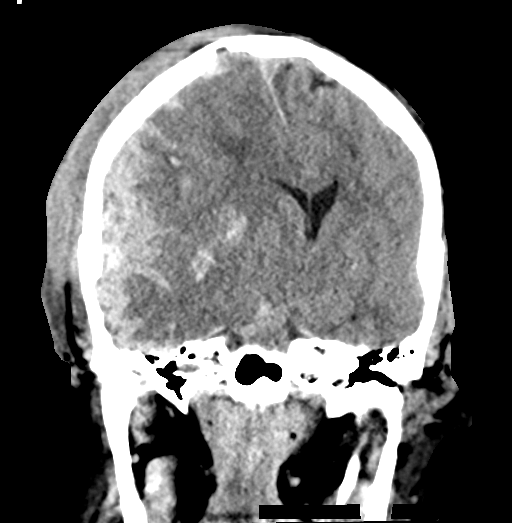

[Series 6: sag soft · sagittal · 0.37mm/px · 3 of 63 slices shown]
[im 21/63  brain]
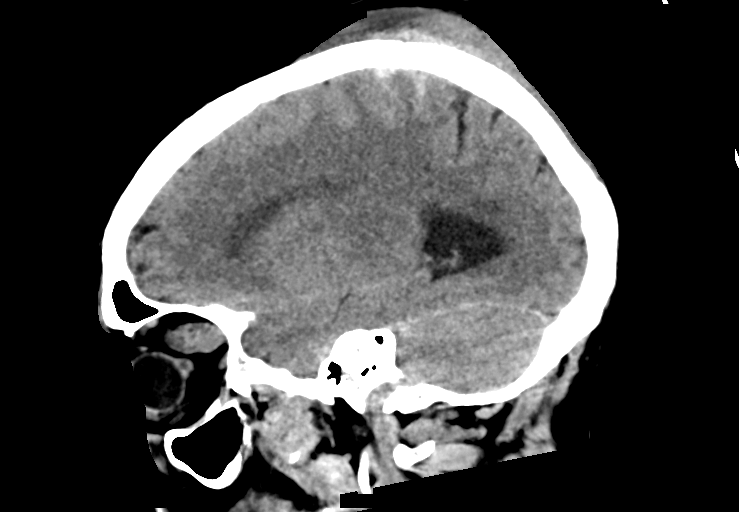
[im 32/63  brain]
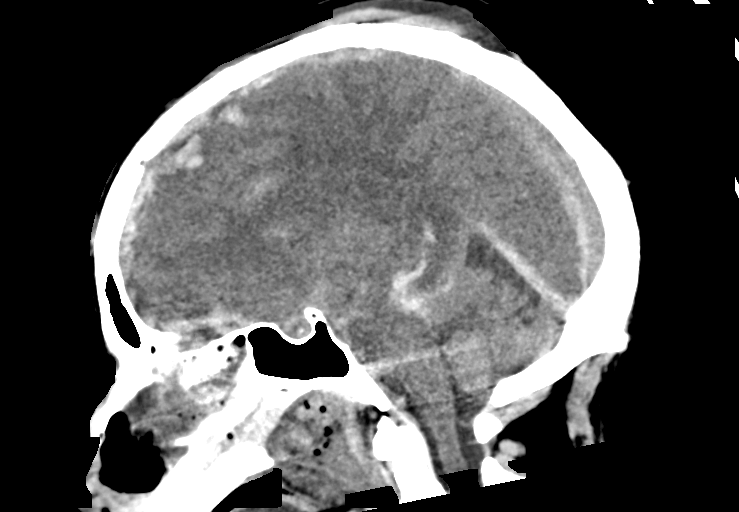
[im 42/63  brain]
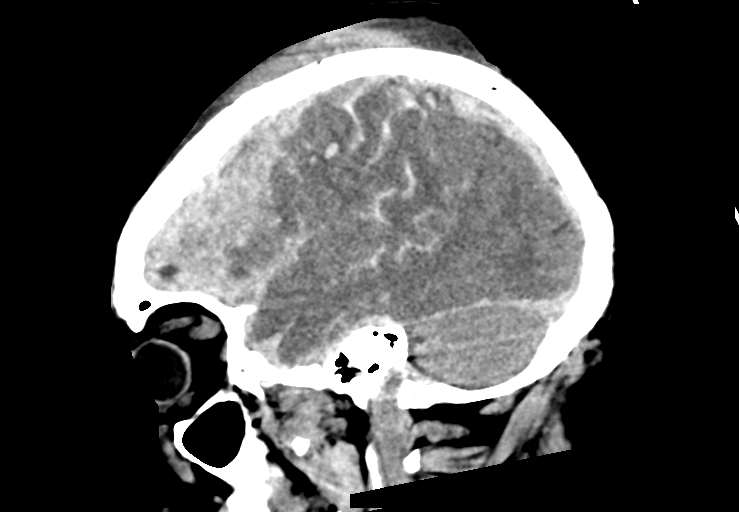

[13 of 47 positions shown; findings below may reference images not displayed]

FINDINGS: CT HEAD FINDINGS

Brain: Large right subdural hematoma is present overlying the right
cerebral convexity demonstrating significant mass effect upon the
right cerebral hemisphere. There is resultant 17 mm of right to left
midline shift. There is right uncal herniation with the medial
temporal lobe extending below the tentorium and demonstrating
significant mass effect upon the brainstem with obliteration of the
suprasellar cistern. Mass effect results in obliteration of the
right lateral ventricle. The left lateral ventricle is of normal
caliber. The third ventricle is obliterated.

There is extensive subarachnoid hemorrhage throughout the right
cerebral hemisphere as well as a probable 9 mm intraparenchymal
hematoma within the lateral right temporal lobe, though this
represents a minority of hemorrhage. Subdural hemorrhage layers
along the falx and right tentorium. Extensive hemorrhage is seen
within the suprasellar, right ambient, interpeduncular and
quadrigeminal plate cisterns. Small subdural hemorrhage overlies the
left frontal lobe anteriorly. Moderate subarachnoid hemorrhage seen
at the vertex overlying the left parietal lobe.

Large scalp hematoma is seen at the vertex extending into the left
parietal region and right temporoparietal region.

Vascular: No asymmetric hyperdense vasculature at the skull base
peer

Skull: An acute coronal fracture of the calvarium is seen extending
from the vertex into the left parietal bone and right temporal bone
with mild diastasis of the fracture at the vertex.

Sinuses/Orbits: There is opacification of the nasopharynx,
nonspecific in the setting of intubation and nasal packing. There is
fluid opacification of several ethmoid air cells as well as
air-fluid levels within the left frontal sinus and left maxillary
sinus, possibly representing blood in this acutely traumatized
patient. The orbits are unremarkable.

Other: Mastoid air cells and middle ear cavities are clear.

CT CERVICAL SPINE FINDINGS

Alignment: Straightening of the cervical spine is likely positional
in nature. No listhesis.

Skull base and vertebrae: Craniocervical junction is unremarkable.
Atlantal dental interval is normal. No acute fracture of the
cervical spine. No lytic or blastic bone lesion. There is, however,
dislocation of the left sternoclavicular joint, incompletely
evaluated on this examination with an associated hematoma
demonstrating mass effect upon the left thyroid lobe and cervical
vasculature.

Soft tissues and spinal canal: No canal hematoma. No prevertebral
soft tissue swelling. Endotracheal tube in position. Mediastinal
hematoma and hemorrhage insinuating within the left paratracheal
soft tissues secondary to sternoclavicular dislocation. This is
incompletely evaluated on this examination.

Disc levels: There is intervertebral disc space narrowing and
endplate remodeling at C5-6, C6-7, C7-T1, and T1-T2 in keeping with
changes of advanced degenerative disc disease. Vertebral body height
has been preserved. The prevertebral soft tissues are not obviously
thickened though endotracheal tube placement slightly limits
evaluation. The spinal canal appears widely patent. Axial images
demonstrates degenerative disc and degenerative joint disease
without significant neural foraminal narrowing or canal stenosis.

Upper chest: Left sternoclavicular dislocation with associated
hematoma.

Other: None signified
IMPRESSION: Coronal calvarial fracture. Large scalp hematoma. Extensive
intracranial subdural and subarachnoid hemorrhage with significant
mass effect upon the right cerebral hemisphere with resultant uncal
herniation, obliteration of the right lateral and third ventricles,
and significant mass effect upon the brainstem. Small right temporal
intraparenchymal hematoma. Small left frontal subdural and parietal
subarachnoid hemorrhage.

No acute fracture of the cervical spine.

Left sternoclavicular dislocation with associated paratracheal and
mediastinal hematoma, incompletely evaluated.

These results were called by telephone at the time of interpretation
on 06/30/2020 at [DATE] to provider PAULUS N CEEJAY , who verbally
acknowledged these results.

## 2021-07-31 IMAGING — CT CT CHEST-ABD-PELV W/ CM
2 of 5 series · 12 of 36 positions shown, 14 images · IV contrast (omnipaque)
Comparison: None.
COMPARISON: None.

Addendum:
CLINICAL DATA: Status post fall.

EXAM:
CT CHEST, ABDOMEN, AND PELVIS WITH CONTRAST
TECHNIQUE: Multidetector CT imaging of the chest, abdomen and pelvis was
performed following the standard protocol during bolus
administration of intravenous contrast.
CONTRAST:  100mL OMNIPAQUE IOHEXOL 300 MG/ML  SOLN

[Series 3: cap with · axial · 0.95mm/px · z∈[-820,-265]mm · 9 of 139 slices shown, 11 images]
[im 14/139  mediastinal]
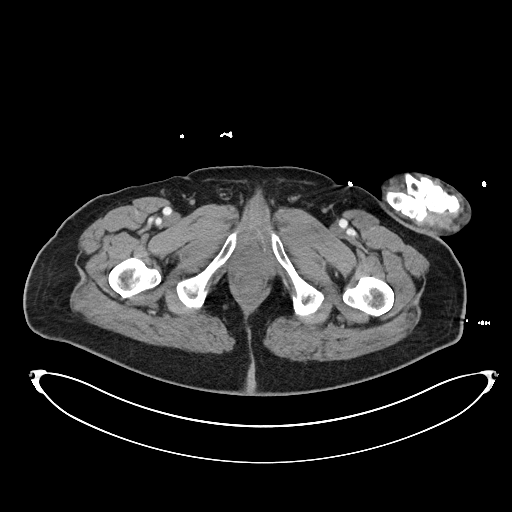
[im 14/139  bone]
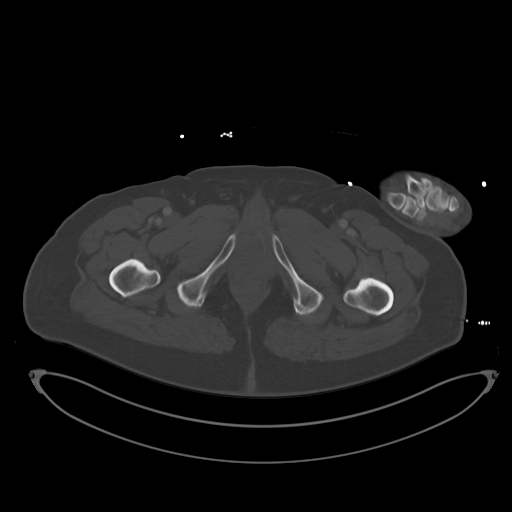
[im 28/139  mediastinal]
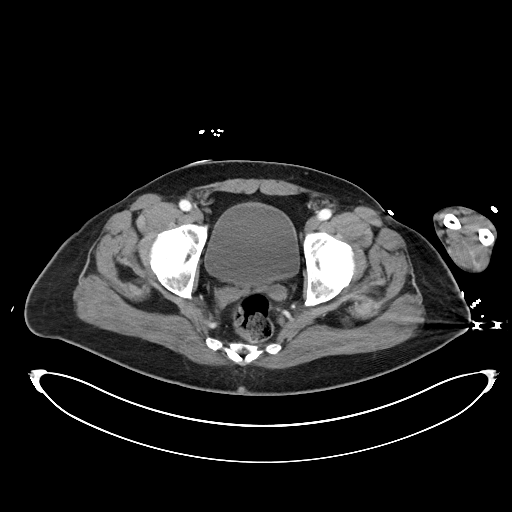
[im 42/139  mediastinal]
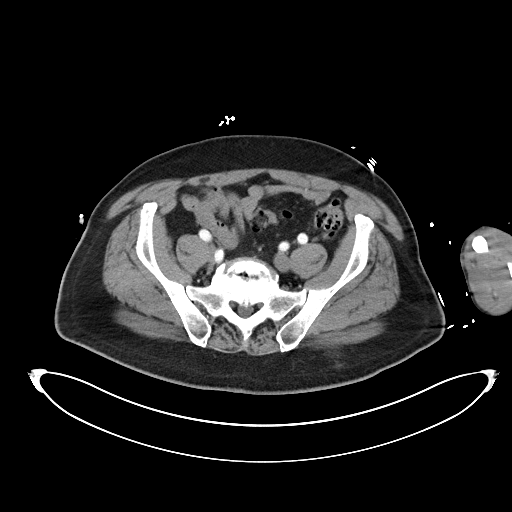
[im 56/139  mediastinal]
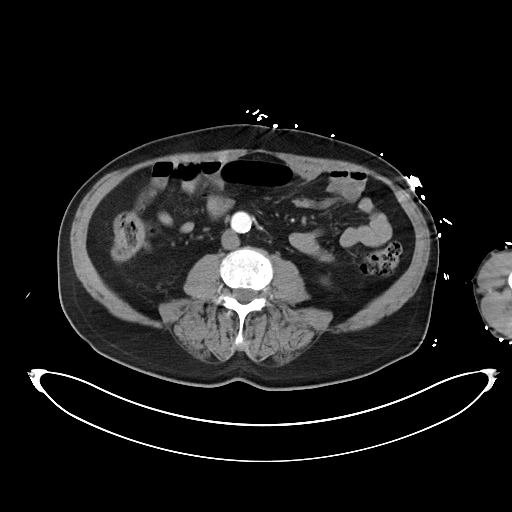
[im 70/139  mediastinal]
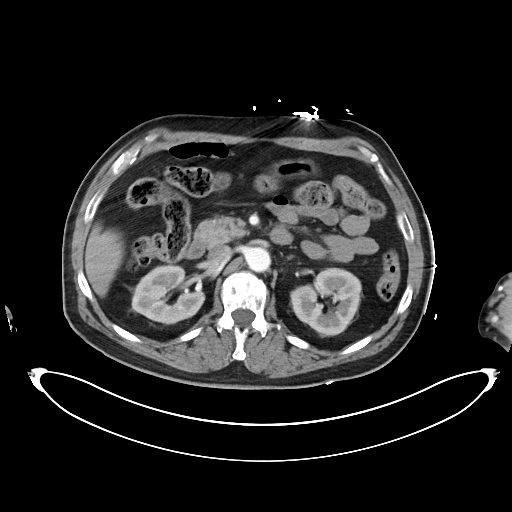
[im 83/139  mediastinal]
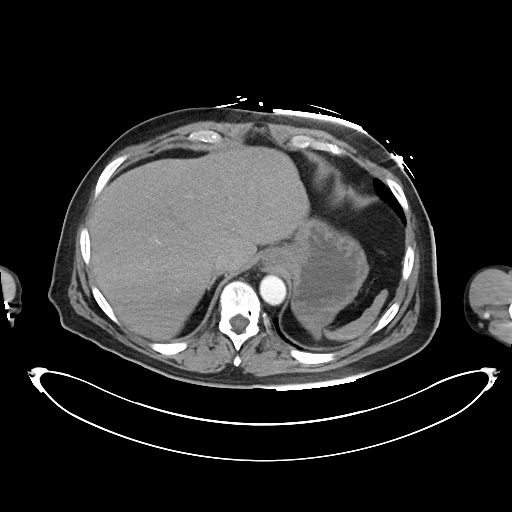
[im 97/139  mediastinal]
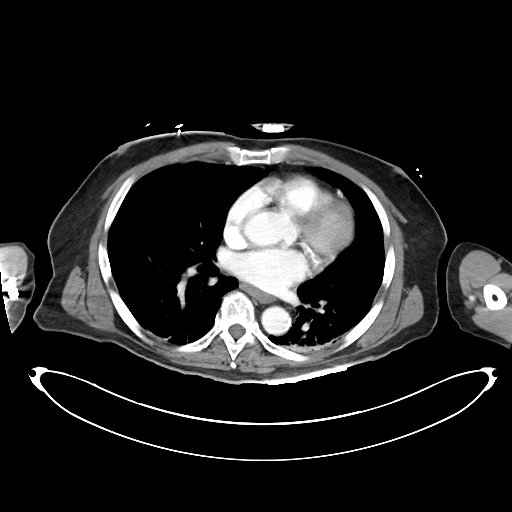
[im 111/139  mediastinal]
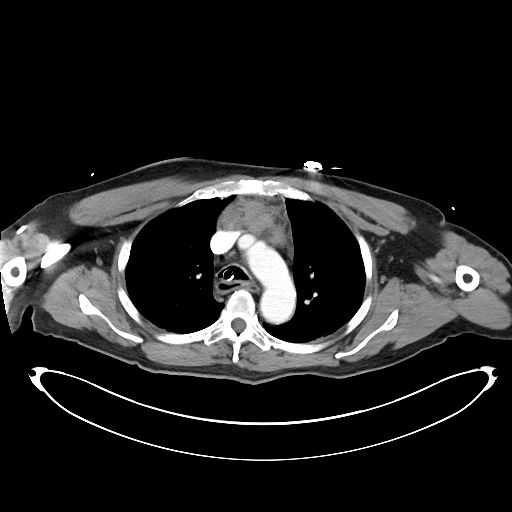
[im 125/139  mediastinal]
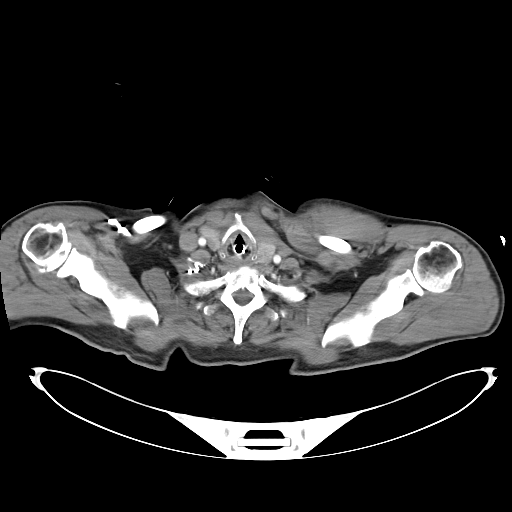
[im 125/139  bone]
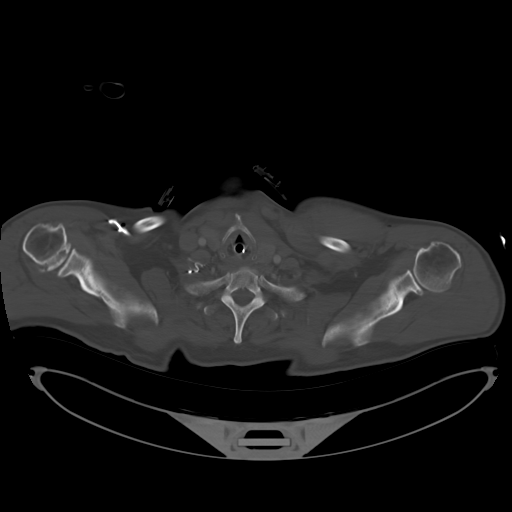

[Series 6: cor · coronal · 0.94mm/px · 3 of 94 slices shown]
[im 19/94  mediastinal]
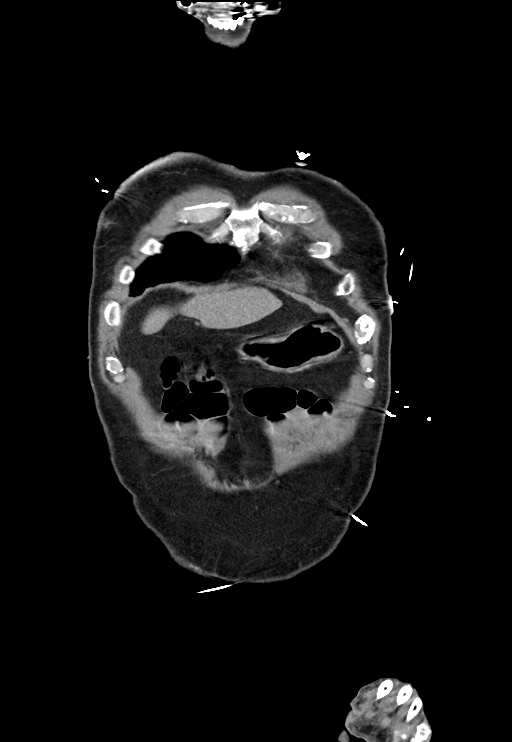
[im 38/94  mediastinal]
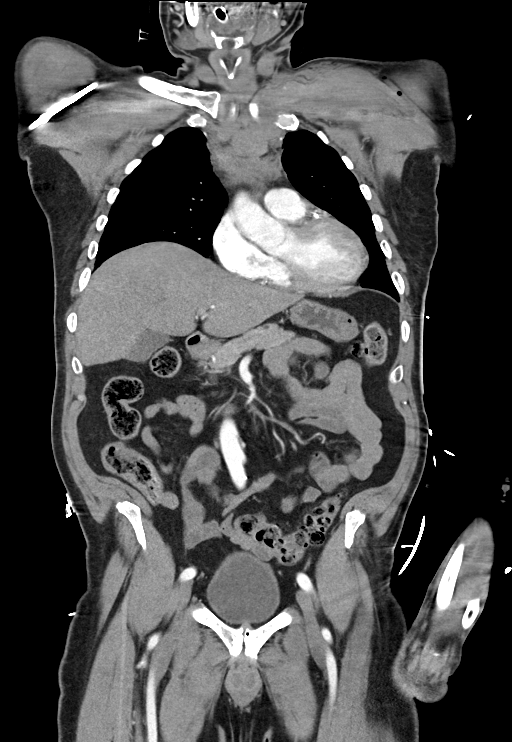
[im 56/94  mediastinal]
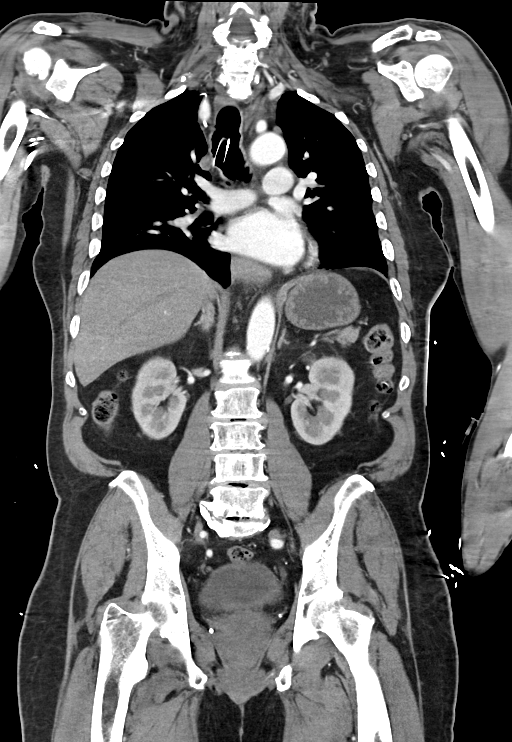

[12 of 36 positions shown; findings below may reference images not displayed]

FINDINGS: CT CHEST FINDINGS

Cardiovascular: There is mild calcification of the aortic arch.
Normal heart size with marked severity coronary artery
calcification. No pericardial effusion.

Mediastinum/Nodes: A 7.1 cm x 3.6 cm isodense area is seen within
the anterior mediastinum. There is a moderate amount of surrounding
inflammatory fat stranding. This extends from the level of the
clavicles to the level of the main pulmonary artery. A tiny, thin
curvilinear area of contrast attenuation is seen anteriorly (axial
CT images 24 and 25, CT series number 3).

No enlarged mediastinal, hilar or axillary lymph nodes are seen. The
thyroid gland, trachea and esophagus demonstrate no abnormalities.

Lungs/Pleura: An endotracheal tube is seen.

Mild atelectasis is seen along the posterior aspect of the bilateral
lower lobes.

There is no evidence of a pleural effusion or pneumothorax.

Musculoskeletal: Posterior dislocation of the head of the left
clavicle is seen.

Multilevel degenerative changes are noted throughout the thoracic
spine.

CT ABDOMEN PELVIS FINDINGS

Hepatobiliary: No focal liver abnormality is seen. No gallstones,
gallbladder wall thickening, or biliary dilatation.

Pancreas: Unremarkable. No pancreatic ductal dilatation or
surrounding inflammatory changes.

Spleen: Normal in size without focal abnormality.

Adrenals/Urinary Tract: Adrenal glands are unremarkable. Kidneys are
normal, without renal calculi, focal lesion, or hydronephrosis.
Bladder is unremarkable.

Stomach/Bowel: Stomach is within normal limits. Appendix appears
normal. No evidence of bowel dilatation. Noninflamed diverticula are
seen throughout the large bowel.

Vascular/Lymphatic: There is moderate severity calcification of the
abdominal aorta and bilateral common iliac arteries, without
evidence of aneurysmal dilatation. No enlarged abdominal or pelvic
lymph nodes.

Reproductive: The prostate gland is moderately enlarged.

Other: No abdominal wall hernia or abnormality. No abdominopelvic
ascites.

Musculoskeletal: Degenerative changes are seen throughout the lumbar
spine, with a chronic compression fracture deformity noted at the
level of L1.
IMPRESSION: 1. Large anterior mediastinal hematoma, with additional findings
suggestive of active bleeding.
2. Posterior dislocation of the head of the left clavicle.
Subsequent vascular injury is suspected, resulting in the previously
noted anterior mediastinal hematoma.
3. Mild bilateral lower lobe atelectasis.
4. Colonic diverticulosis without evidence of acute diverticulitis.
5. Enlarged prostate gland.
6. Chronic compression fracture deformity at the level of L1.
7. Aortic atherosclerosis.

Aortic Atherosclerosis (5AN2I-N07.7).

ADDENDUM:
Results were discussed with Dr. Joshjax at [DATE] p.m. Eastern on
June 30, 2020.

*** End of Addendum ***
FINDINGS: CT CHEST FINDINGS

Cardiovascular: There is mild calcification of the aortic arch.
Normal heart size with marked severity coronary artery
calcification. No pericardial effusion.

Mediastinum/Nodes: A 7.1 cm x 3.6 cm isodense area is seen within
the anterior mediastinum. There is a moderate amount of surrounding
inflammatory fat stranding. This extends from the level of the
clavicles to the level of the main pulmonary artery. A tiny, thin
curvilinear area of contrast attenuation is seen anteriorly (axial
CT images 24 and 25, CT series number 3).

No enlarged mediastinal, hilar or axillary lymph nodes are seen. The
thyroid gland, trachea and esophagus demonstrate no abnormalities.

Lungs/Pleura: An endotracheal tube is seen.

Mild atelectasis is seen along the posterior aspect of the bilateral
lower lobes.

There is no evidence of a pleural effusion or pneumothorax.

Musculoskeletal: Posterior dislocation of the head of the left
clavicle is seen.

Multilevel degenerative changes are noted throughout the thoracic
spine.

CT ABDOMEN PELVIS FINDINGS

Hepatobiliary: No focal liver abnormality is seen. No gallstones,
gallbladder wall thickening, or biliary dilatation.

Pancreas: Unremarkable. No pancreatic ductal dilatation or
surrounding inflammatory changes.

Spleen: Normal in size without focal abnormality.

Adrenals/Urinary Tract: Adrenal glands are unremarkable. Kidneys are
normal, without renal calculi, focal lesion, or hydronephrosis.
Bladder is unremarkable.

Stomach/Bowel: Stomach is within normal limits. Appendix appears
normal. No evidence of bowel dilatation. Noninflamed diverticula are
seen throughout the large bowel.

Vascular/Lymphatic: There is moderate severity calcification of the
abdominal aorta and bilateral common iliac arteries, without
evidence of aneurysmal dilatation. No enlarged abdominal or pelvic
lymph nodes.

Reproductive: The prostate gland is moderately enlarged.

Other: No abdominal wall hernia or abnormality. No abdominopelvic
ascites.

Musculoskeletal: Degenerative changes are seen throughout the lumbar
spine, with a chronic compression fracture deformity noted at the
level of L1.
IMPRESSION: 1. Large anterior mediastinal hematoma, with additional findings
suggestive of active bleeding.
2. Posterior dislocation of the head of the left clavicle.
Subsequent vascular injury is suspected, resulting in the previously
noted anterior mediastinal hematoma.
3. Mild bilateral lower lobe atelectasis.
4. Colonic diverticulosis without evidence of acute diverticulitis.
5. Enlarged prostate gland.
6. Chronic compression fracture deformity at the level of L1.
7. Aortic atherosclerosis.

Aortic Atherosclerosis (5AN2I-N07.7).
